# Patient Record
Sex: Female | Born: 1950 | Race: White | Hispanic: No | Marital: Married | Smoking: Former smoker
Health system: Southern US, Community
[De-identification: ages and names within clinical notes are randomized; demographics above are authoritative.]

## PROBLEM LIST (undated history)

## (undated) DIAGNOSIS — E079 Disorder of thyroid, unspecified: Secondary | ICD-10-CM

## (undated) HISTORY — DX: Disorder of thyroid, unspecified: E07.9

## (undated) HISTORY — PX: ADENOIDECTOMY: SUR15

## (undated) HISTORY — PX: COLONOSCOPY: SHX174

---

## 2009-01-31 ENCOUNTER — Encounter (HOSPITAL_COMMUNITY): Admission: RE | Admit: 2009-01-31 | Discharge: 2009-05-01 | Payer: Self-pay | Admitting: Endocrinology

## 2009-02-07 ENCOUNTER — Ambulatory Visit (HOSPITAL_COMMUNITY): Admission: RE | Admit: 2009-02-07 | Discharge: 2009-02-07 | Payer: Self-pay | Admitting: Endocrinology

## 2010-04-30 ENCOUNTER — Ambulatory Visit: Payer: Self-pay | Admitting: Internal Medicine

## 2010-04-30 LAB — HM DEXA SCAN: HM Dexa Scan: NORMAL

## 2010-07-03 ENCOUNTER — Ambulatory Visit: Payer: Self-pay | Admitting: Unknown Physician Specialty

## 2010-07-04 LAB — PATHOLOGY REPORT

## 2011-06-30 ENCOUNTER — Encounter: Payer: Self-pay | Admitting: Internal Medicine

## 2011-06-30 DIAGNOSIS — Z0289 Encounter for other administrative examinations: Secondary | ICD-10-CM

## 2011-08-04 ENCOUNTER — Ambulatory Visit (INDEPENDENT_AMBULATORY_CARE_PROVIDER_SITE_OTHER): Payer: BC Managed Care – PPO | Admitting: Internal Medicine

## 2011-08-04 ENCOUNTER — Encounter: Payer: Self-pay | Admitting: Internal Medicine

## 2011-08-04 DIAGNOSIS — M25531 Pain in right wrist: Secondary | ICD-10-CM

## 2011-08-04 DIAGNOSIS — M25539 Pain in unspecified wrist: Secondary | ICD-10-CM

## 2011-08-04 DIAGNOSIS — E039 Hypothyroidism, unspecified: Secondary | ICD-10-CM

## 2011-08-04 DIAGNOSIS — Z0000 Encounter for general adult medical examination without abnormal findings: Secondary | ICD-10-CM

## 2011-08-04 NOTE — Progress Notes (Signed)
Subjective:    Patient ID: Tara Coleman, female    DOB: 1950-09-16, 60 y.o.   MRN: [id number]  HPI 60 year old female with a history of hypothyroidism presents for annual exam. She denies any complaints today with the exception of mild chronic pain in her right wrist. She is a [employer] forces operational officer and attributes the pain in her wrist to overuse. She reports that she has started to wear a brace with some improvement. Aside from this, she denies any complaints today. She notes that she was recently seen by her endocrinologist and her TSH level which was drawn was normal. She reports healthy diet and regular exercise. She reports normal energy level.  Outpatient Encounter Prescriptions as of 08/04/2011  Medication Sig Dispense Refill  . levothyroxine (SYNTHROID, LEVOTHROID) 100 MCG tablet Take 100 mcg by mouth daily.          Review of Systems  Constitutional: Negative for fever, chills, appetite change, fatigue and unexpected weight change.  HENT: Negative for ear pain, congestion, sore throat, trouble swallowing, neck pain, voice change and sinus pressure.   Eyes: Negative for visual disturbance.  Respiratory: Negative for cough, shortness of breath, wheezing and stridor.   Cardiovascular: Negative for chest pain, palpitations and leg swelling.  Gastrointestinal: Negative for nausea, vomiting, abdominal pain, diarrhea, constipation, blood in stool, abdominal distention and anal bleeding.  Genitourinary: Negative for dysuria and flank pain.  Musculoskeletal: Positive for arthralgias (right wrist). Negative for myalgias and gait problem.  Skin: Negative for color change and rash.  Neurological: Negative for dizziness and headaches.  Hematological: Negative for adenopathy. Does not bruise/bleed easily.  Psychiatric/Behavioral: Negative for suicidal ideas, sleep disturbance and dysphoric mood. The patient is not nervous/anxious.    BP 102/62  Pulse 64  Temp(Src) 98 F (36.7 C) (Oral)  Wt 188  lb (85.276 kg)  SpO2 96%     Objective:   Physical Exam  Constitutional: She is oriented to person, place, and time. She appears well-developed and well-nourished. No distress.  HENT:  Head: Normocephalic and atraumatic.  Right Ear: External ear normal.  Left Ear: External ear normal.  Nose: Nose normal.  Mouth/Throat: Oropharynx is clear and moist. No oropharyngeal exudate.  Eyes: Conjunctivae are normal. Pupils are equal, round, and reactive to light. Right eye exhibits no discharge. Left eye exhibits no discharge. No scleral icterus.  Neck: Normal range of motion. Neck supple. No tracheal deviation present. No thyromegaly present.  Cardiovascular: Normal rate, regular rhythm, normal heart sounds and intact distal pulses.  Exam reveals no gallop and no friction rub.   No murmur heard. Pulmonary/Chest: Effort normal and breath sounds normal. No respiratory distress. She has no wheezes. She has no rales. She exhibits no tenderness. Right breast exhibits no inverted nipple, no mass, no nipple discharge, no skin change and no tenderness. Left breast exhibits no inverted nipple, no mass, no nipple discharge, no skin change and no tenderness. Breasts are symmetrical.  Abdominal: Soft. She exhibits no distension and no mass. There is no tenderness. There is no rebound and no guarding.  Musculoskeletal: Normal range of motion. She exhibits no edema and no tenderness.  Lymphadenopathy:    She has no cervical adenopathy.  Neurological: She is alert and oriented to person, place, and time. No cranial nerve deficit. She exhibits normal muscle tone. Coordination normal.  Skin: Skin is warm and dry. No rash noted. She is not diaphoretic. No erythema. No pallor.  Psychiatric: She has a normal mood and affect. Her behavior is  normal. Judgment and thought content normal.          Assessment & Plan:  1. General exam - Exam including breast exam normal. PAP deferred as normal last year. Mammogram  scheduled. Pt would prefer to repeat labs q2years. Will get records on previous labs and evaluation from former office. Flu vaccine declined.  2. Hypothyroidism - Pt reports recent TSH was normal. Will get records on this. Continue synthroid.  3. 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 Will continue to monitor for now. Will use ibuprofen prn pain. Pt will call if symptoms worsening.

## 2011-12-01 ENCOUNTER — Ambulatory Visit: Payer: Self-pay | Admitting: Internal Medicine

## 2011-12-08 ENCOUNTER — Encounter: Payer: Self-pay | Admitting: Internal Medicine

## 2012-08-05 ENCOUNTER — Encounter: Payer: Self-pay | Admitting: Internal Medicine

## 2012-08-05 ENCOUNTER — Ambulatory Visit (INDEPENDENT_AMBULATORY_CARE_PROVIDER_SITE_OTHER): Payer: BC Managed Care – PPO | Admitting: Internal Medicine

## 2012-08-05 VITALS — BP 104/76 | HR 75 | Temp 97.9°F | Resp 16 | Ht 67.0 in | Wt 187.8 lb

## 2012-08-05 DIAGNOSIS — Z1331 Encounter for screening for depression: Secondary | ICD-10-CM

## 2012-08-05 DIAGNOSIS — Z0000 Encounter for general adult medical examination without abnormal findings: Secondary | ICD-10-CM

## 2012-08-05 DIAGNOSIS — E039 Hypothyroidism, unspecified: Secondary | ICD-10-CM

## 2012-08-05 DIAGNOSIS — Z13220 Encounter for screening for lipoid disorders: Secondary | ICD-10-CM

## 2012-08-05 LAB — CBC WITH DIFFERENTIAL/PLATELET
Basophils Relative: 0.9 % (ref 0.0–3.0)
Eosinophils Absolute: 0.1 10*3/uL (ref 0.0–0.7)
Hemoglobin: 13.9 g/dL (ref 12.0–15.0)
Lymphocytes Relative: 26 % (ref 12.0–46.0)
MCHC: 33.1 g/dL (ref 30.0–36.0)
MCV: 87.6 fl (ref 78.0–100.0)
Neutro Abs: 3.5 10*3/uL (ref 1.4–7.7)
RBC: 4.81 Mil/uL (ref 3.87–5.11)

## 2012-08-05 LAB — COMPREHENSIVE METABOLIC PANEL
ALT: 16 U/L (ref 0–35)
CO2: 28 mEq/L (ref 19–32)
Calcium: 8.8 mg/dL (ref 8.4–10.5)
Chloride: 103 mEq/L (ref 96–112)
GFR: 75.6 mL/min (ref >60.00)
Sodium: 137 mEq/L (ref 135–145)
Total Protein: 7 g/dL (ref 6.0–8.3)

## 2012-08-05 LAB — LIPID PANEL: VLDL: 15.8 mg/dL (ref 0.0–40.0)

## 2012-08-05 LAB — LDL CHOLESTEROL, DIRECT: Direct LDL: 129.8 mg/dL

## 2012-08-05 NOTE — Assessment & Plan Note (Signed)
Symptomatically doing well. Will continue synthroid. Will request records from recent endocrinology evaluation. Follow up 1 year and prn.

## 2012-08-05 NOTE — Assessment & Plan Note (Signed)
General medical exam normal today including breast exam. PAP and pelvic deferred given PAP 2012 normal/HPV neg. Will check labs including CBC, CMP, lipids, Vit D. Health maintenance is UTD.  Mammogram ordered. Follow up 1 year for physical exam.

## 2012-08-05 NOTE — Progress Notes (Signed)
Subjective:    Patient ID: Tara Coleman, female    DOB: 1951/04/04, 61 y.o.   MRN: [id number]  HPI 61YO female with h/o hypothyroidism presents for annual exam. She was recently seen by her dentist and evaluated for left lower gum pain and diagnosed as having an abscess.  She has been treated with amoxicillin and is scheduled for root canal next week. She denies any fever, chills. Pain is controlled with tylenol alone. She reports she is otherwise feeling well. No new concerns today. Follows a healthy diet and gets regular physical activity. Recently traveled to [place].  Outpatient Encounter Prescriptions as of 08/05/2012  Medication Sig Dispense Refill  . amoxicillin (AMOXIL) 500 MG capsule       . levothyroxine (SYNTHROID, LEVOTHROID) 100 MCG tablet Take 100 mcg by mouth daily.        Marland Kitchen VICODIN ES 7.5-300 MG TABS        BP 104/76  Pulse 75  Temp 97.9 F (36.6 C) (Oral)  Resp 16  Ht 5\' 7"  (1.702 m)  Wt 187 lb 12 oz (85.163 kg)  BMI 29.41 kg/m2  SpO2 98%  Review of Systems  Constitutional: Negative for fever, chills, appetite change, fatigue and unexpected weight change.  HENT: Negative for ear pain, congestion, sore throat, trouble swallowing, neck pain, voice change and sinus pressure.   Eyes: Negative for visual disturbance.  Respiratory: Negative for cough, shortness of breath, wheezing and stridor.   Cardiovascular: Negative for chest pain, palpitations and leg swelling.  Gastrointestinal: Negative for nausea, vomiting, abdominal pain, diarrhea, constipation, blood in stool, abdominal distention and anal bleeding.  Genitourinary: Negative for dysuria and flank pain.  Musculoskeletal: Negative for myalgias, arthralgias and gait problem.  Skin: Negative for color change and rash.  Neurological: Negative for dizziness and headaches.  Hematological: Negative for adenopathy. Does not bruise/bleed easily.  Psychiatric/Behavioral: Negative for suicidal ideas, sleep disturbance  and dysphoric mood. The patient is not nervous/anxious.        Objective:   Physical Exam  Constitutional: She is oriented to person, place, and time. She appears well-developed and well-nourished. No distress.  HENT:  Head: Normocephalic and atraumatic.  Right Ear: External ear normal.  Left Ear: External ear normal.  Nose: Nose normal.  Mouth/Throat: Oropharynx is clear and moist. No oropharyngeal exudate.  Eyes: Conjunctivae normal are normal. Pupils are equal, round, and reactive to light. Right eye exhibits no discharge. Left eye exhibits no discharge. No scleral icterus.  Neck: Normal range of motion. Neck supple. No tracheal deviation present. No thyromegaly present.  Cardiovascular: Normal rate, regular rhythm, normal heart sounds and intact distal pulses.  Exam reveals no gallop and no friction rub.   No murmur heard. Pulmonary/Chest: Effort normal and breath sounds normal. No accessory muscle usage. Not tachypneic. No respiratory distress. She has no wheezes. She has no rales. She exhibits no tenderness. Right breast exhibits no inverted nipple, no mass, no nipple discharge, no skin change and no tenderness. Left breast exhibits no inverted nipple, no mass, no nipple discharge, no skin change and no tenderness. Breasts are symmetrical.  Abdominal: Soft. Bowel sounds are normal. She exhibits no distension. There is no tenderness. There is no rebound and no guarding.  Musculoskeletal: Normal range of motion. She exhibits no edema and no tenderness.  Lymphadenopathy:    She has no cervical adenopathy.  Neurological: She is alert and oriented to person, place, and time. No cranial nerve deficit. She exhibits normal muscle tone. Coordination normal.  Skin: Skin is warm and dry. No rash noted. She is not diaphoretic. No erythema. No pallor.  Psychiatric: She has a normal mood and affect. Her behavior is normal. Judgment and thought content normal.          Assessment & Plan:

## 2012-10-01 ENCOUNTER — Other Ambulatory Visit: Payer: Self-pay

## 2012-10-14 ENCOUNTER — Encounter: Payer: Self-pay | Admitting: Internal Medicine

## 2012-12-30 ENCOUNTER — Telehealth: Payer: Self-pay | Admitting: Internal Medicine

## 2012-12-30 DIAGNOSIS — Z1239 Encounter for other screening for malignant neoplasm of breast: Secondary | ICD-10-CM

## 2012-12-30 NOTE — Telephone Encounter (Signed)
Patient needing an order for a mammogram

## 2013-01-19 ENCOUNTER — Ambulatory Visit: Payer: Self-pay | Admitting: Internal Medicine

## 2013-01-26 LAB — HM MAMMOGRAPHY: HM MAMMO: NORMAL

## 2013-01-30 ENCOUNTER — Telehealth: Payer: Self-pay | Admitting: Internal Medicine

## 2013-01-30 NOTE — Telephone Encounter (Signed)
She needs to be seen in a visit to discuss evaluation and treatment of vasculitis. Will need records from [facility].

## 2013-01-30 NOTE — Telephone Encounter (Signed)
Left message to call back  

## 2013-01-30 NOTE — Telephone Encounter (Signed)
Fwd to Dr. Walker 

## 2013-01-30 NOTE — Telephone Encounter (Signed)
Patient Information:  Caller Name: Kelsee  Phone: [phone]  Patient: Tara Coleman, Tara Coleman  Gender: Female  DOB: 1951/05/12  Age: 62 Years  PCP: Ronna Polio (Adults only)  Office Follow Up:  Does the office need to follow up with this patient?: No  Instructions For The Office: N/A  RN Note:  Instructed pt that since she was seen in [facility] by their physicians and pt is not in town to have an office visit here, RN instructed pt to call [facility] that she was seen at and ask them if they can order  anything topically to treat this rash while waiting on their lab results to come back.  Pt also wanted ot know why an email that she sent to Dr Dan Humphreys was never answered.  RN reviewed in chart and noted that Dr Dan Humphreys answered her email approx 1 hour after pt sent it to her.  Pt instructed that Dr Dan Humphreys instructed to set up an appt to discuss.  Pt states that she never received it and she ended up getting the Rx from her dermatologist.  Symptoms  Reason For Call & Symptoms: Pt states that she is in [place] and on 01/28/13 started with a huge bright red rash on both of her legs; non itchy; not painful;  on 01/29/13 she went to [facility] and dx with vasculitis;  pt is wnting to know if MD in the office can start her on any medicine to treat vasculitis  Reviewed Health History In EMR: Yes  Reviewed Medications In EMR: Yes  Reviewed Allergies In EMR: Yes  Reviewed Surgeries / Procedures: Yes  Date of Onset of Symptoms: 01/28/2013  Guideline(s) Used:  Rash or Redness - Localized  Disposition Per Guideline:   Home Care  Reason For Disposition Reached:   Mild localized rash  Advice Given:  Call Back If:  You become worse.  Patient Will Follow Care Advice:  YES

## 2013-01-31 NOTE — Telephone Encounter (Signed)
Pt was calling back after missed call from the office on 01/30/13.  RN advised per Dr Dan Humphreys, that she would need an appt to discuss treatment and evaluation of vasculitis.  RN offered to make pt an appt but Dr Dan Humphreys did not have any appts this afternoon and pt did not want to see anyone.  Pt states she will try to see if her dermatologist can see her today (01/31/13)

## 2013-02-06 ENCOUNTER — Encounter: Payer: Self-pay | Admitting: Internal Medicine

## 2013-06-22 ENCOUNTER — Other Ambulatory Visit: Payer: Self-pay

## 2013-06-29 ENCOUNTER — Encounter: Payer: Self-pay | Admitting: Endocrinology

## 2013-07-04 ENCOUNTER — Other Ambulatory Visit: Payer: BC Managed Care – PPO

## 2013-07-06 ENCOUNTER — Ambulatory Visit: Payer: BC Managed Care – PPO | Admitting: Endocrinology

## 2013-08-07 ENCOUNTER — Encounter: Payer: BC Managed Care – PPO | Admitting: Internal Medicine

## 2013-08-18 ENCOUNTER — Encounter: Payer: BC Managed Care – PPO | Admitting: Internal Medicine

## 2013-09-28 ENCOUNTER — Encounter: Payer: Self-pay | Admitting: Internal Medicine

## 2013-09-28 ENCOUNTER — Ambulatory Visit (INDEPENDENT_AMBULATORY_CARE_PROVIDER_SITE_OTHER): Payer: BC Managed Care – PPO | Admitting: Internal Medicine

## 2013-09-28 VITALS — BP 110/62 | HR 80 | Temp 97.8°F | Ht 67.0 in | Wt 186.0 lb

## 2013-09-28 DIAGNOSIS — E039 Hypothyroidism, unspecified: Secondary | ICD-10-CM

## 2013-09-28 DIAGNOSIS — L719 Rosacea, unspecified: Secondary | ICD-10-CM | POA: Insufficient documentation

## 2013-09-28 DIAGNOSIS — R799 Abnormal finding of blood chemistry, unspecified: Secondary | ICD-10-CM

## 2013-09-28 DIAGNOSIS — Z1283 Encounter for screening for malignant neoplasm of skin: Secondary | ICD-10-CM | POA: Insufficient documentation

## 2013-09-28 DIAGNOSIS — Z0000 Encounter for general adult medical examination without abnormal findings: Secondary | ICD-10-CM | POA: Insufficient documentation

## 2013-09-28 MED ORDER — LEVOTHYROXINE SODIUM 100 MCG PO TABS: 100.0000 ug | ORAL_TABLET | Freq: Every day | ORAL | Status: DC

## 2013-09-28 MED ORDER — BRIMONIDINE TARTRATE 0.33 % EX GEL: CUTANEOUS | Status: DC

## 2013-09-28 NOTE — Assessment & Plan Note (Signed)
Symptomatically doing well. Will check TSH with labs. Continue Levothyroxine. 

## 2013-09-28 NOTE — Progress Notes (Signed)
Subjective:    Patient ID: Tara Coleman, female    DOB: 01-28-1951, 63 y.o.   MRN: [id number]  HPI 63YO female presents for annual exam. She is generally feeling well. No concerns today. Trying to follow healthy diet. Plays tennis 1-2 times per week and walks.  Notes that she had a rash earlier this year, dark red-purple bilateral lower legs. Non-painful. Non-pruritic. No known exposures to new chemicals, detergents, perfumes. Rash resolved without intervention. Diagnosed at urgent care possible vasculitis. No recurrence of rash noted.  Review of Systems  Constitutional: Negative for fever, chills, appetite change, fatigue and unexpected weight change.  HENT: Negative for congestion, ear pain, sinus pressure, sore throat, trouble swallowing and voice change.   Eyes: Negative for visual disturbance.  Respiratory: Negative for cough, shortness of breath, wheezing and stridor.   Cardiovascular: Negative for chest pain, palpitations and leg swelling.  Gastrointestinal: Negative for nausea, vomiting, abdominal pain, diarrhea, constipation, blood in stool, abdominal distention and anal bleeding.  Genitourinary: Negative for dysuria and flank pain.  Musculoskeletal: Negative for arthralgias, gait problem, myalgias and neck pain.  Skin: Negative for color change and rash.  Neurological: Negative for dizziness and headaches.  Hematological: Negative for adenopathy. Does not bruise/bleed easily.  Psychiatric/Behavioral: Negative for suicidal ideas, sleep disturbance and dysphoric mood. The patient is not nervous/anxious.        Objective:    BP 110/62  Pulse 80  Temp(Src) 97.8 F (36.6 C) (Oral)  Ht 5\' 7"  (1.702 m)  Wt 186 lb (84.369 kg)  BMI 29.12 kg/m2  SpO2 98% Physical Exam  Constitutional: She is oriented to person, place, and time. She appears well-developed and well-nourished. No distress.  HENT:  Head: Normocephalic and atraumatic.  Right Ear: External ear normal.  Left  Ear: External ear normal.  Nose: Nose normal.  Mouth/Throat: Oropharynx is clear and moist. No oropharyngeal exudate.  Eyes: Conjunctivae are normal. Pupils are equal, round, and reactive to light. Right eye exhibits no discharge. Left eye exhibits no discharge. No scleral icterus.  Neck: Normal range of motion. Neck supple. No tracheal deviation present. No thyromegaly present.  Cardiovascular: Normal rate, regular rhythm, normal heart sounds and intact distal pulses.  Exam reveals no gallop and no friction rub.   No murmur heard. Pulmonary/Chest: Effort normal and breath sounds normal. No accessory muscle usage. Not tachypneic. No respiratory distress. She has no decreased breath sounds. She has no wheezes. She has no rales. She exhibits no tenderness. Right breast exhibits no inverted nipple, no mass, no nipple discharge, no skin change and no tenderness. Left breast exhibits no inverted nipple, no mass, no nipple discharge, no skin change and no tenderness. Breasts are symmetrical.  Abdominal: Soft. Bowel sounds are normal. She exhibits no distension and no mass. There is no tenderness. There is no rebound and no guarding.  Musculoskeletal: Normal range of motion. She exhibits no edema and no tenderness.  Lymphadenopathy:    She has no cervical adenopathy.  Neurological: She is alert and oriented to person, place, and time. No cranial nerve deficit. She exhibits normal muscle tone. Coordination normal.  Skin: Skin is warm and dry. No rash noted. She is not diaphoretic. No erythema. No pallor.  Psychiatric: She has a normal mood and affect. Her behavior is normal. Judgment and thought content normal.          Assessment & Plan:   Problem List Items Addressed This Visit   Hypothyroidism     Symptomatically doing well.  Will check TSH with labs. Continue Levothyroxine.    Relevant Medications      levothyroxine (SYNTHROID, LEVOTHROID) tablet   Other Relevant Orders      TSH   Rosacea     Relevant Medications      Brimonidine Tartrate (MIRVASO) 0.33 % GEL   Routine general medical examination at a health care facility - Primary     General medical exam including breast exam normal today. PAP and pelvic deferred as normal PAP 04/2010, HPV neg. Plan repeat PAP 2016. Mammogram UTD 01/2013. Colonoscopy due 2016. Encouraged healthy diet and regular physical activity. Discussed Prevnar, and she will look into this. Declines Flu vaccine.    Relevant Orders      CBC with Differential      Comprehensive metabolic panel      Lipid panel      Vit D  25 hydroxy (rtn osteoporosis monitoring)   Screening for skin cancer   Relevant Orders      Ambulatory referral to Dermatology       Return in about 1 year (around 09/28/2014) for Physical.

## 2013-09-28 NOTE — Progress Notes (Signed)
Pre-visit discussion using our clinic review tool. No additional management support is needed unless otherwise documented below in the visit note.  

## 2013-09-28 NOTE — Assessment & Plan Note (Signed)
Symptoms well controlled with Mirvaso. Will continue.

## 2013-09-28 NOTE — Assessment & Plan Note (Signed)
General medical exam including breast exam normal today. PAP and pelvic deferred as normal PAP 04/2010, HPV neg. Plan repeat PAP 2016. Mammogram UTD 01/2013. Colonoscopy due 2016. Encouraged healthy diet and regular physical activity. Discussed Prevnar, and she will look into this. Declines Flu vaccine.

## 2013-09-29 LAB — TSH: TSH: 3.23 u[IU]/mL (ref 0.35–5.50)

## 2013-09-29 LAB — COMPREHENSIVE METABOLIC PANEL
ALT: 16 U/L (ref 0–35)
AST: 20 U/L (ref 0–37)
Albumin: 4.1 g/dL (ref 3.5–5.2)
Alkaline Phosphatase: 70 U/L (ref 39–117)
BUN: 14 mg/dL (ref 6–23)
CALCIUM: 8.9 mg/dL (ref 8.4–10.5)
CO2: 22 mEq/L (ref 19–32)
CREATININE: 1 mg/dL (ref 0.4–1.2)
Chloride: 105 mEq/L (ref 96–112)
GFR: 60.59 mL/min (ref >60.00)
Glucose, Bld: 83 mg/dL (ref 70–99)
POTASSIUM: 3.9 meq/L (ref 3.5–5.1)
SODIUM: 138 meq/L (ref 135–145)
Total Bilirubin: 0.8 mg/dL (ref 0.3–1.2)
Total Protein: 7.2 g/dL (ref 6.0–8.3)

## 2013-09-29 LAB — CBC WITH DIFFERENTIAL/PLATELET
BASOS ABS: 0 10*3/uL (ref 0.0–0.1)
Basophils Relative: 0.6 % (ref 0.0–3.0)
Eosinophils Absolute: 0.1 10*3/uL (ref 0.0–0.7)
Eosinophils Relative: 1.6 % (ref 0.0–5.0)
HEMATOCRIT: 42.8 % (ref 36.0–46.0)
Hemoglobin: 14 g/dL (ref 12.0–15.0)
LYMPHS ABS: 2.2 10*3/uL (ref 0.7–4.0)
Lymphocytes Relative: 29.2 % (ref 12.0–46.0)
MCHC: 32.7 g/dL (ref 30.0–36.0)
MCV: 89 fl (ref 78.0–100.0)
MONO ABS: 0.7 10*3/uL (ref 0.1–1.0)
Monocytes Relative: 9 % (ref 3.0–12.0)
NEUTROS PCT: 59.6 % (ref 43.0–77.0)
Neutro Abs: 4.5 10*3/uL (ref 1.4–7.7)
PLATELETS: 289 10*3/uL (ref 150.0–400.0)
RBC: 4.8 Mil/uL (ref 3.87–5.11)
RDW: 14.1 % (ref 11.5–14.6)
WBC: 7.6 10*3/uL (ref 4.5–10.5)

## 2013-09-29 LAB — LIPID PANEL
CHOLESTEROL: 217 mg/dL — AB (ref 0–200)
HDL: 71.4 mg/dL (ref >39.00)
TRIGLYCERIDES: 115 mg/dL (ref 0.0–149.0)
Total CHOL/HDL Ratio: 3
VLDL: 23 mg/dL (ref 0.0–40.0)

## 2013-09-29 LAB — LDL CHOLESTEROL, DIRECT: Direct LDL: 129.7 mg/dL

## 2013-09-29 LAB — VITAMIN D 25 HYDROXY (VIT D DEFICIENCY, FRACTURES): Vit D, 25-Hydroxy: 32 ng/mL (ref 30–89)

## 2013-10-05 ENCOUNTER — Encounter: Payer: Self-pay | Admitting: Emergency Medicine

## 2013-11-15 DIAGNOSIS — D239 Other benign neoplasm of skin, unspecified: Secondary | ICD-10-CM

## 2013-11-15 HISTORY — DX: Other benign neoplasm of skin, unspecified: D23.9

## 2014-01-13 ENCOUNTER — Other Ambulatory Visit: Payer: Self-pay | Admitting: Endocrinology

## 2014-01-22 ENCOUNTER — Ambulatory Visit: Payer: Self-pay | Admitting: Internal Medicine

## 2014-01-22 LAB — HM MAMMOGRAPHY: HM MAMMO: NEGATIVE

## 2014-01-31 ENCOUNTER — Encounter: Payer: Self-pay | Admitting: *Deleted

## 2014-10-02 ENCOUNTER — Ambulatory Visit (INDEPENDENT_AMBULATORY_CARE_PROVIDER_SITE_OTHER): Payer: BLUE CROSS/BLUE SHIELD | Admitting: Internal Medicine

## 2014-10-02 ENCOUNTER — Encounter: Payer: Self-pay | Admitting: Internal Medicine

## 2014-10-02 VITALS — BP 111/68 | HR 82 | Temp 97.9°F | Ht 67.25 in | Wt 194.0 lb

## 2014-10-02 DIAGNOSIS — Z0000 Encounter for general adult medical examination without abnormal findings: Secondary | ICD-10-CM

## 2014-10-02 DIAGNOSIS — R0600 Dyspnea, unspecified: Secondary | ICD-10-CM

## 2014-10-02 DIAGNOSIS — E669 Obesity, unspecified: Secondary | ICD-10-CM

## 2014-10-02 DIAGNOSIS — E039 Hypothyroidism, unspecified: Secondary | ICD-10-CM

## 2014-10-02 LAB — HEMOGLOBIN A1C: HEMOGLOBIN A1C: 5.6 % (ref 4.6–6.5)

## 2014-10-02 LAB — CBC WITH DIFFERENTIAL/PLATELET
BASOS PCT: 0.3 % (ref 0.0–3.0)
Basophils Absolute: 0 10*3/uL (ref 0.0–0.1)
EOS ABS: 0.1 10*3/uL (ref 0.0–0.7)
EOS PCT: 1.3 % (ref 0.0–5.0)
HEMATOCRIT: 42.8 % (ref 36.0–46.0)
HEMOGLOBIN: 14.1 g/dL (ref 12.0–15.0)
LYMPHS ABS: 1.8 10*3/uL (ref 0.7–4.0)
Lymphocytes Relative: 22.6 % (ref 12.0–46.0)
MCHC: 33.1 g/dL (ref 30.0–36.0)
MCV: 86.7 fl (ref 78.0–100.0)
MONO ABS: 0.5 10*3/uL (ref 0.1–1.0)
Monocytes Relative: 6 % (ref 3.0–12.0)
NEUTROS ABS: 5.5 10*3/uL (ref 1.4–7.7)
NEUTROS PCT: 69.8 % (ref 43.0–77.0)
Platelets: 307 10*3/uL (ref 150.0–400.0)
RBC: 4.94 Mil/uL (ref 3.87–5.11)
RDW: 13.9 % (ref 11.5–15.5)
WBC: 7.9 10*3/uL (ref 4.0–10.5)

## 2014-10-02 LAB — MICROALBUMIN / CREATININE URINE RATIO
Creatinine,U: 124.6 mg/dL
MICROALB/CREAT RATIO: 0.6 mg/g (ref 0.0–30.0)
Microalb, Ur: <0.7 mg/dL (ref 0.0–1.9)

## 2014-10-02 LAB — COMPREHENSIVE METABOLIC PANEL
ALK PHOS: 80 U/L (ref 39–117)
ALT: 14 U/L (ref 0–35)
AST: 17 U/L (ref 0–37)
Albumin: 4.1 g/dL (ref 3.5–5.2)
BILIRUBIN TOTAL: 0.7 mg/dL (ref 0.2–1.2)
BUN: 13 mg/dL (ref 6–23)
CHLORIDE: 104 meq/L (ref 96–112)
CO2: 28 meq/L (ref 19–32)
CREATININE: 1 mg/dL (ref 0.40–1.20)
Calcium: 8.9 mg/dL (ref 8.4–10.5)
GFR: 59.69 mL/min — AB (ref >60.00)
Glucose, Bld: 125 mg/dL — ABNORMAL HIGH (ref 70–99)
Potassium: 4.2 mEq/L (ref 3.5–5.1)
Sodium: 137 mEq/L (ref 135–145)
Total Protein: 6.9 g/dL (ref 6.0–8.3)

## 2014-10-02 LAB — VITAMIN D 25 HYDROXY (VIT D DEFICIENCY, FRACTURES): VITD: 26.05 ng/mL — ABNORMAL LOW (ref 30.00–100.00)

## 2014-10-02 LAB — TSH: TSH: 1.76 u[IU]/mL (ref 0.35–4.50)

## 2014-10-02 LAB — LIPID PANEL
CHOLESTEROL: 198 mg/dL (ref 0–200)
HDL: 63.3 mg/dL (ref >39.00)
LDL Cholesterol: 118 mg/dL — ABNORMAL HIGH (ref 0–99)
NONHDL: 134.7
Total CHOL/HDL Ratio: 3
Triglycerides: 86 mg/dL (ref 0.0–149.0)
VLDL: 17.2 mg/dL (ref 0.0–40.0)

## 2014-10-02 MED ORDER — LEVOTHYROXINE SODIUM 100 MCG PO TABS: ORAL_TABLET | ORAL | Status: DC

## 2014-10-02 NOTE — Assessment & Plan Note (Signed)
Wt Readings from Last 3 Encounters:  10/02/14 194 lb (87.998 kg)  09/28/13 186 lb (84.369 kg)  08/05/12 187 lb 12 oz (85.163 kg)   Body mass index is 30.16 kg/(m^2). Encouraged healthy diet and exercise.

## 2014-10-02 NOTE — Patient Instructions (Signed)

## 2014-10-02 NOTE — Assessment & Plan Note (Signed)
Will check TSH with labs. Continue Levothyroxine. 

## 2014-10-02 NOTE — Assessment & Plan Note (Signed)
General medical exam including breast exam normal today. PAP and pelvic deferred per pt preference. Last PAP 2011 normal, HPV neg. Mammogram UTD and reviewed. Colonoscopy due in 06/2015. Flu vaccine declined. Labs today including CBC, CMP ,lipids, TSH, A1c. Encouraged healthy diet and exercise.

## 2014-10-02 NOTE — Addendum Note (Signed)
Addended by: Ronette Deter A on: 10/02/2014 01:59 PM   Modules accepted: Miquel Dunn

## 2014-10-02 NOTE — Progress Notes (Signed)
Pre visit review using our clinic review tool, if applicable. No additional management support is needed unless otherwise documented below in the visit note. 

## 2014-10-02 NOTE — Progress Notes (Addendum)
Subjective:    Patient ID: Tara Coleman, female    DOB: 06/17/1951, 64 y.o.   MRN: [id number]  HPI  64YO female presents for annual exam.  Trying to eat healthy. Plays tennis for exercise. Occasionally smokes, perhaps less than 1 pack per week off and on. Has quit several times. Notes some dyspnea this year when walking up steps in [place]. No current symptoms of dyspnea, chest pain, cough. She plays tennis on a regular basis with no problems.  Wt Readings from Last 3 Encounters:  10/02/14 194 lb (87.998 kg)  09/28/13 186 lb (84.369 kg)  08/05/12 187 lb 12 oz (85.163 kg)    Past medical, surgical, family and social history per today's encounter.  Review of Systems  Constitutional: Negative for fever, chills, appetite change, fatigue and unexpected weight change.  Eyes: Negative for visual disturbance.  Respiratory: Positive for shortness of breath. Negative for cough and wheezing.   Cardiovascular: Negative for chest pain and leg swelling.  Gastrointestinal: Negative for nausea, vomiting, abdominal pain, diarrhea and constipation.  Musculoskeletal: Negative for myalgias and arthralgias.  Skin: Negative for color change and rash.  Hematological: Negative for adenopathy. Does not bruise/bleed easily.  Psychiatric/Behavioral: Negative for sleep disturbance and dysphoric mood. The patient is not nervous/anxious.        Objective:    BP 111/68 mmHg  Pulse 82  Temp(Src) 97.9 F (36.6 C) (Oral)  Ht 5' 7.25" (1.708 m)  Wt 194 lb (87.998 kg)  BMI 30.16 kg/m2  SpO2 99% Physical Exam  Constitutional: She is oriented to person, place, and time. She appears well-developed and well-nourished. No distress.  HENT:  Head: Normocephalic and atraumatic.  Right Ear: External ear normal.  Left Ear: External ear normal.  Nose: Nose normal.  Mouth/Throat: Oropharynx is clear and moist. No oropharyngeal exudate.  Eyes: Conjunctivae are normal. Pupils are equal, round, and  reactive to light. Right eye exhibits no discharge. Left eye exhibits no discharge. No scleral icterus.  Neck: Normal range of motion. Neck supple. No tracheal deviation present. No thyromegaly present.  Cardiovascular: Normal rate, regular rhythm, normal heart sounds and intact distal pulses.  Exam reveals no gallop and no friction rub.   No murmur heard. Pulmonary/Chest: Effort normal and breath sounds normal. No accessory muscle usage. No tachypnea. No respiratory distress. She has no decreased breath sounds. She has no wheezes. She has no rales. She exhibits no tenderness. Right breast exhibits no inverted nipple, no mass, no nipple discharge, no skin change and no tenderness. Left breast exhibits no inverted nipple, no mass, no nipple discharge, no skin change and no tenderness. Breasts are symmetrical.  Abdominal: Soft. Bowel sounds are normal. She exhibits no distension and no mass. There is no tenderness. There is no rebound and no guarding.  Musculoskeletal: Normal range of motion. She exhibits no edema or tenderness.  Lymphadenopathy:    She has no cervical adenopathy.  Neurological: She is alert and oriented to person, place, and time. No cranial nerve deficit. She exhibits normal muscle tone. Coordination normal.  Skin: Skin is warm and dry. No rash noted. She is not diaphoretic. No erythema. No pallor.  Psychiatric: She has a normal mood and affect. Her behavior is normal. Judgment and thought content normal.          Assessment & Plan:   Problem List Items Addressed This Visit      Unprioritized   Dyspnea    Occasional mild dyspnea on exertion, ie walking up  50 steps. Likely deconditioning.Will get CXR given smoking history. Exam is normal. Pt will follow up if any worsening or persistent symptoms.      Relevant Orders   DG Chest 2 View   Hypothyroidism    Will check TSH with labs. Continue Levothyroxine.      Relevant Medications   levothyroxine (SYNTHROID,  LEVOTHROID) tablet   Obesity (BMI 30-39.9)    Wt Readings from Last 3 Encounters:  10/02/14 194 lb (87.998 kg)  09/28/13 186 lb (84.369 kg)  08/05/12 187 lb 12 oz (85.163 kg)   Body mass index is 30.16 kg/(m^2). Encouraged healthy diet and exercise.      Routine general medical examination at a health care facility - Primary    General medical exam including breast exam normal today. PAP and pelvic deferred per pt preference. Last PAP 2011 normal, HPV neg. Mammogram UTD and reviewed. Colonoscopy due in 06/2015. Flu vaccine declined. Labs today including CBC, CMP ,lipids, TSH, A1c. Encouraged healthy diet and exercise.      Relevant Orders   CBC with Differential/Platelet   Comprehensive metabolic panel   Lipid panel   Microalbumin / creatinine urine ratio   Vit D  25 hydroxy (rtn osteoporosis monitoring)   TSH   Hemoglobin A1c       Return in about 1 year (around 10/03/2015) for Physical.

## 2014-10-02 NOTE — Assessment & Plan Note (Signed)
Occasional mild dyspnea on exertion, ie walking up 50 steps. Likely deconditioning.Will get CXR given smoking history. Exam is normal. Pt will follow up if any worsening or persistent symptoms.

## 2014-10-08 ENCOUNTER — Ambulatory Visit (INDEPENDENT_AMBULATORY_CARE_PROVIDER_SITE_OTHER)
Admission: RE | Admit: 2014-10-08 | Discharge: 2014-10-08 | Disposition: A | Discharge status: Home / Self Care | Payer: BLUE CROSS/BLUE SHIELD | Source: Ambulatory Visit | Attending: Internal Medicine | Admitting: Internal Medicine

## 2014-10-08 DIAGNOSIS — R0600 Dyspnea, unspecified: Secondary | ICD-10-CM

## 2014-12-10 ENCOUNTER — Other Ambulatory Visit: Payer: Self-pay | Admitting: Internal Medicine

## 2015-09-24 ENCOUNTER — Ambulatory Visit (INDEPENDENT_AMBULATORY_CARE_PROVIDER_SITE_OTHER): Payer: BLUE CROSS/BLUE SHIELD | Admitting: Internal Medicine

## 2015-09-24 ENCOUNTER — Encounter: Payer: Self-pay | Admitting: Internal Medicine

## 2015-09-24 VITALS — BP 110/71 | HR 87 | Temp 98.8°F | Wt 179.0 lb

## 2015-09-24 DIAGNOSIS — R42 Dizziness and giddiness: Secondary | ICD-10-CM | POA: Diagnosis not present

## 2015-09-24 LAB — TSH: TSH: 1.38 u[IU]/mL (ref 0.35–4.50)

## 2015-09-24 LAB — COMPREHENSIVE METABOLIC PANEL
ALBUMIN: 4.5 g/dL (ref 3.5–5.2)
ALK PHOS: 84 U/L (ref 39–117)
ALT: 15 U/L (ref 0–35)
AST: 21 U/L (ref 0–37)
BILIRUBIN TOTAL: 0.9 mg/dL (ref 0.2–1.2)
BUN: 15 mg/dL (ref 6–23)
CALCIUM: 9.7 mg/dL (ref 8.4–10.5)
CHLORIDE: 102 meq/L (ref 96–112)
CO2: 30 mEq/L (ref 19–32)
CREATININE: 0.81 mg/dL (ref 0.40–1.20)
GFR: 75.89 mL/min (ref >60.00)
Glucose, Bld: 82 mg/dL (ref 70–99)
Potassium: 4.1 mEq/L (ref 3.5–5.1)
Sodium: 138 mEq/L (ref 135–145)
Total Protein: 7.4 g/dL (ref 6.0–8.3)

## 2015-09-24 LAB — CBC WITH DIFFERENTIAL/PLATELET
BASOS PCT: 0.6 % (ref 0.0–3.0)
Basophils Absolute: 0 10*3/uL (ref 0.0–0.1)
EOS ABS: 0.2 10*3/uL (ref 0.0–0.7)
EOS PCT: 2.2 % (ref 0.0–5.0)
HCT: 45.9 % (ref 36.0–46.0)
HEMOGLOBIN: 15.3 g/dL — AB (ref 12.0–15.0)
LYMPHS ABS: 2.2 10*3/uL (ref 0.7–4.0)
Lymphocytes Relative: 31.4 % (ref 12.0–46.0)
MCHC: 33.4 g/dL (ref 30.0–36.0)
MCV: 86.7 fl (ref 78.0–100.0)
MONO ABS: 0.5 10*3/uL (ref 0.1–1.0)
Monocytes Relative: 7.5 % (ref 3.0–12.0)
NEUTROS ABS: 4.1 10*3/uL (ref 1.4–7.7)
NEUTROS PCT: 58.3 % (ref 43.0–77.0)
PLATELETS: 296 10*3/uL (ref 150.0–400.0)
RBC: 5.3 Mil/uL — ABNORMAL HIGH (ref 3.87–5.11)
RDW: 13.9 % (ref 11.5–15.5)
WBC: 7 10*3/uL (ref 4.0–10.5)

## 2015-09-24 LAB — VITAMIN B12: Vitamin B-12: 309 pg/mL (ref 211–911)

## 2015-09-24 MED ORDER — MECLIZINE HCL 25 MG PO TABS: 25.0000 mg | ORAL_TABLET | Freq: Three times a day (TID) | ORAL | Status: DC | PRN

## 2015-09-24 MED ORDER — PREDNISONE 10 MG PO TABS: ORAL_TABLET | ORAL | Status: DC

## 2015-09-24 NOTE — Progress Notes (Signed)
Subjective:    Patient ID: Tara Coleman, female    DOB: 05-02-1951, 65 y.o.   MRN: [id number]  HPI  65YO female presents for acute visit.  Dizziness - Started suddenly last Saturday when sitting waiting for a meeting. Described as vertigo. Lasted about 15-30sec. Then occurred while sitting at tennis match.No chest pain, nausea, diaphoresis, palpitations during episodes. Thursday, occurred when driving. Had to pull over and wait for symptoms to resolve. In between episodes, feels well. Denies anxiety. No recent changes to medication. Has been on weight watchers for a few months. Hit head on cabinet several days before dizziness started. No LOC, dizziness, nausea after this event. No headache. No recent illness. No congestion, fever. Denies ear pain. Does occasionally have allergy symptoms including watery eyes and rhinorrhea. Not taking anything for symptoms.    Wt Readings from Last 3 Encounters:  09/24/15 179 lb (81.194 kg)  10/02/14 194 lb (87.998 kg)  09/28/13 186 lb (84.369 kg)   BP Readings from Last 3 Encounters:  09/24/15 110/71  10/02/14 111/68  09/28/13 110/62    Past Medical History  Diagnosis Date  . Thyroid disease    Family History  Problem Relation Age of Onset  . Cancer Mother   . Thyroid disease Sister   . Breast cancer Neg Hx   . Colon cancer Neg Hx   . Psoriasis Son    Past Surgical History  Procedure Laterality Date  . Cesarean section      x2   . Adenoidectomy      as a child   Social History   Social History  . Marital Status: Married    Spouse Name: N/A  . Number of Children: 2  . Years of Education: N/A   Occupational History  . Self Employed - Tennis    Social History Main Topics  . Smoking status: Current Every Day Smoker    Last Attempt to Quit: 08/03/2009  . Smokeless tobacco: Never Used  . Alcohol Use: 0.0 oz/week    0 Standard drinks or equivalent per week     Comment: Glass wine hs  . Drug Use: No  . Sexual  Activity: Not Asked   Other Topics Concern  . None   Social History Narrative    Review of Systems  Constitutional: Negative for fever, chills, fatigue and unexpected weight change.  HENT: Positive for rhinorrhea and sneezing. Negative for congestion, ear discharge, ear pain, facial swelling, hearing loss, mouth sores, nosebleeds, postnasal drip, sinus pressure, sore throat, tinnitus, trouble swallowing and voice change.   Eyes: Negative for pain, discharge, redness and visual disturbance.  Respiratory: Negative for cough, chest tightness, shortness of breath, wheezing and stridor.   Cardiovascular: Negative for chest pain, palpitations and leg swelling.  Gastrointestinal: Negative for nausea and vomiting.  Musculoskeletal: Negative for myalgias, arthralgias, neck pain and neck stiffness.  Skin: Negative for color change and rash.  Neurological: Positive for dizziness. Negative for tremors, seizures, syncope, speech difficulty, weakness, light-headedness, numbness and headaches.  Hematological: Negative for adenopathy.       Objective:    BP 110/71 mmHg  Pulse 87  Temp(Src) 98.8 F (37.1 C) (Oral)  Wt 179 lb (81.194 kg)  SpO2 100% Physical Exam  Constitutional: She is oriented to person, place, and time. She appears well-developed and well-nourished. No distress.  HENT:  Head: Normocephalic and atraumatic.  Right Ear: External ear normal. Tympanic membrane is bulging. Tympanic membrane is not erythematous. A middle ear effusion is  present.  Left Ear: External ear normal. Tympanic membrane is bulging. Tympanic membrane is not erythematous. A middle ear effusion is present.  Nose: Nose normal.  Mouth/Throat: Oropharynx is clear and moist. No oropharyngeal exudate.  Eyes: Conjunctivae are normal. Pupils are equal, round, and reactive to light. Right eye exhibits no discharge. Left eye exhibits no discharge. No scleral icterus.  Neck: Normal range of motion. Neck supple. Carotid  bruit is not present. No tracheal deviation present. No thyroid mass and no thyromegaly present.  Cardiovascular: Normal rate, regular rhythm, normal heart sounds and intact distal pulses.  Exam reveals no gallop and no friction rub.   No murmur heard. Pulmonary/Chest: Effort normal and breath sounds normal. No respiratory distress. She has no wheezes. She has no rales. She exhibits no tenderness.  Musculoskeletal: Normal range of motion. She exhibits no edema or tenderness.  Lymphadenopathy:    She has no cervical adenopathy.  Neurological: She is alert and oriented to person, place, and time. No cranial nerve deficit. She exhibits normal muscle tone. Coordination normal.  Skin: Skin is warm and dry. No rash noted. She is not diaphoretic. No erythema. No pallor.  Psychiatric: She has a normal mood and affect. Her behavior is normal. Judgment and thought content normal.          Assessment & Plan:   Problem List Items Addressed This Visit      Unprioritized   Vertigo - Primary    We discussed potential causes of vertigo and potential additional evaluation. Exam is remarkable for bilateral middle ear effusion. Suspect that increased pressure in the middle ear causing vertigo. Will try Prednisone taper. Add Meclizine prn. Discussed potential CT head given her head injury prior to onset of symptoms, however she would like to avoid if possible given the cost. We also discussed potentially ordering carotid dopplers if symptoms persist. Will check labs today including electrolytes, thyroid function, blood counts, B12. Follow up by email in 2 days. If no improvement, consider CT head and ENT evaluation.      Relevant Orders   CBC with Differential/Platelet   Comprehensive metabolic panel   TSH   [id number]       Return in about 2 weeks (around 10/08/2015) for Recheck.

## 2015-09-24 NOTE — Assessment & Plan Note (Signed)
We discussed potential causes of vertigo and potential additional evaluation. Exam is remarkable for bilateral middle ear effusion. Suspect that increased pressure in the middle ear causing vertigo. Will try Prednisone taper. Add Meclizine prn. Discussed potential CT head given her head injury prior to onset of symptoms, however she would like to avoid if possible given the cost. We also discussed potentially ordering carotid dopplers if symptoms persist. Will check labs today including electrolytes, thyroid function, blood counts, B12. Follow up by email in 2 days. If no improvement, consider CT head and ENT evaluation.

## 2015-09-24 NOTE — Progress Notes (Signed)
Pre visit review using our clinic review tool, if applicable. No additional management support is needed unless otherwise documented below in the visit note. 

## 2015-09-24 NOTE — Patient Instructions (Signed)
Start Prednisone taper today.  Use Meclizine as needed for dizziness.  Labs today.  Email with update Thursday. If no improvement, we will consider ENT referral and imaging.

## 2015-09-27 ENCOUNTER — Encounter: Payer: Self-pay | Admitting: Internal Medicine

## 2015-10-04 ENCOUNTER — Encounter: Payer: BLUE CROSS/BLUE SHIELD | Admitting: Internal Medicine

## 2015-11-26 ENCOUNTER — Other Ambulatory Visit: Payer: Self-pay | Admitting: Internal Medicine

## 2016-05-13 ENCOUNTER — Other Ambulatory Visit: Payer: Self-pay

## 2016-05-13 MED ORDER — LEVOTHYROXINE SODIUM 100 MCG PO TABS: ORAL_TABLET | ORAL | 1 refills | Status: DC

## 2016-08-26 ENCOUNTER — Ambulatory Visit (INDEPENDENT_AMBULATORY_CARE_PROVIDER_SITE_OTHER): Payer: BLUE CROSS/BLUE SHIELD | Admitting: Family

## 2016-08-26 ENCOUNTER — Encounter: Payer: Self-pay | Admitting: Family

## 2016-08-26 VITALS — BP 126/66 | HR 70 | Temp 97.9°F | Ht 67.0 in | Wt 184.0 lb

## 2016-08-26 DIAGNOSIS — Z0000 Encounter for general adult medical examination without abnormal findings: Secondary | ICD-10-CM

## 2016-08-26 MED ORDER — PNEUMOCOCCAL VAC POLYVALENT 25 MCG/0.5ML IJ INJ: 0.5000 mL | INJECTION | Freq: Once | INTRAMUSCULAR | Status: DC

## 2016-08-26 NOTE — Progress Notes (Signed)
Pre visit review using our clinic review tool, if applicable. No additional management support is needed unless otherwise documented below in the visit note. 

## 2016-08-26 NOTE — Patient Instructions (Addendum)
Due for Tdap- at local pharmacy.   No results for colonoscopy in system. Please bring by at your convenient.  We placed a referral. Mammogram this year. I asked that you call one the below locations and schedule this when it is convenient for you.   If you have dense breasts, you may ask for 3D mammogram over the traditional 2D mammogram as new evidence suggest 3D is superior. Please note that NOT all insurance companies cover 3D and you may have to pay a higher copay. You may call your insurance company to further clarify your benefits.   Options for [place]  [place], [place]  * [facility] [place] [place] * Note if you ask for 3D mammogram at this location, you must request [place], [place] location*

## 2016-08-26 NOTE — Progress Notes (Signed)
Subjective:    Patient ID: Tara Coleman, female    DOB: 25-Aug-1950, 66 y.o.   MRN: [id number]  CC: Tara Coleman is a 66 y.o. female who presents today for physical exam.    HPI: Hypothyroidism- no symptoms. Recently switched to generic.     Colorectal Cancer Screening: UTD 2011, Dr Tiffany Kocher, one polyp,negative for malignancy; on 5 year plan.  Breast Cancer Screening: Due Cervical Cancer Screening: no h/o abnormal pap 6 years ago, negative HPV and malignancy. NO vaginal bleeding.  Bone Health screening/DEXA for 65+: No increased fracture risk. Defer screening at this time. Normal DEXA 2011. Lung Cancer Screening: Has 30 year pack year history and age > 98 years.  Immunizations       Tetanus - Due        Pneumococcal - Candidate for.  Hepatitis C screening - Candidate for HIV Screening- Candidate for  Labs: Screening labs today. Exercise: Gets regular exercise, walks dog, and plays tennis  Alcohol use: occasional Smoking/tobacco use: smoker.  Regular dental exams:UTD Wears seat belt: Yes.  HISTORY:  Past Medical History:  Diagnosis Date  . Thyroid disease     Past Surgical History:  Procedure Laterality Date  . ADENOIDECTOMY     as a child  . CESAREAN SECTION     x2    Family History  Problem Relation Age of Onset  . Cancer Mother 62    biliary duct  . Thyroid disease Sister   . Psoriasis Son   . Colon cancer Maternal Grandfather     lived to 44  . Breast cancer Neg Hx       ALLERGIES: Patient has no known allergies.  Current Outpatient Prescriptions on File Prior to Visit  Medication Sig Dispense Refill  . levothyroxine (SYNTHROID, LEVOTHROID) 100 MCG tablet TAKE 1 TABLET BY MOUTH EVERY MORNING ON AN EMPTY STOMACH 90 tablet 1   No current facility-administered medications on file prior to visit.     Social History  Substance Use Topics  . Smoking status: Current Every Day Smoker    Last attempt to quit: 08/03/2009  . Smokeless tobacco:  Never Used  . Alcohol use 0.0 oz/week     Comment: Glass wine hs    Review of Systems  Constitutional: Negative for chills, fever and unexpected weight change.  HENT: Negative for congestion.   Respiratory: Negative for cough.   Cardiovascular: Negative for chest pain, palpitations and leg swelling.  Gastrointestinal: Negative for nausea and vomiting.  Genitourinary: Negative for dyspareunia, pelvic pain and vaginal bleeding.  Musculoskeletal: Negative for arthralgias and myalgias.  Skin: Negative for rash.  Neurological: Negative for headaches.  Hematological: Negative for adenopathy.  Psychiatric/Behavioral: Negative for confusion.      Objective:    BP 126/66   Pulse 70   Temp 97.9 F (36.6 C) (Oral)   Ht 5\' 7"  (1.702 m)   Wt 184 lb (83.5 kg)   SpO2 97%   BMI 28.82 kg/m   BP Readings from Last 3 Encounters:  08/26/16 126/66  09/24/15 110/71  10/02/14 111/68   Wt Readings from Last 3 Encounters:  08/26/16 184 lb (83.5 kg)  09/24/15 179 lb (81.2 kg)  10/02/14 194 lb (88 kg)    Physical Exam  Constitutional: She appears well-developed and well-nourished.  Eyes: Conjunctivae are normal.  Neck: No thyroid mass and no thyromegaly present.  Cardiovascular: Normal rate, regular rhythm, normal heart sounds and normal pulses.   Pulmonary/Chest: Effort normal and breath  sounds normal. She has no wheezes. She has no rhonchi. She has no rales. Right breast exhibits mass.    CBE performed. Small nodule right breast approx 6o clock   Lymphadenopathy:       Head (right side): No submental, no submandibular, no tonsillar, no preauricular, no posterior auricular and no occipital adenopathy present.       Head (left side): No submental, no submandibular, no tonsillar, no preauricular, no posterior auricular and no occipital adenopathy present.    She has no cervical adenopathy.       Right cervical: No superficial cervical, no deep cervical and no posterior cervical adenopathy  present.      Left cervical: No superficial cervical, no deep cervical and no posterior cervical adenopathy present.    She has no axillary adenopathy.  Neurological: She is alert.  Skin: Skin is warm and dry.  Psychiatric: She has a normal mood and affect. Her speech is normal and behavior is normal. Thought content normal.  Vitals reviewed.      Assessment & Plan:   Problem List Items Addressed This Visit      Other   Routine general medical examination at a health care facility - Primary    Declines flu vaccine. Advised to get Tdap at local pharmacy.Advised to bring by colonoscopy report from [place] from 2011 ( do not have). No longer does Pap smears due to age and preference. Declines pelvic exam today. On breast exam appreciated right breast mass. Have ordered a diagnostic mammogram, and ultrasound. Pneumovax was not given, will be given at followup.       Relevant Medications   pneumococcal 23 valent vaccine (PNU-IMMUNE) injection 0.5 mL   Other Relevant Orders   CBC with Differential/Platelet   Hemoglobin A1c   Hepatitis C antibody   HIV antibody   Comprehensive metabolic panel   Lipid panel   TSH   VITAMIN D 25 Hydroxy (Vit-D Deficiency, Fractures)   US BREAST LTD UNI RIGHT INC AXILLA   MM Digital Diagnostic Bilat   CT CHEST LUNG CANCER SCREENING LOW DOSE WO CONTRAST       I have discontinued Ms. Messing's Brimonidine Tartrate, predniSONE, and meclizine. I am also having her maintain her levothyroxine. We will continue to administer pneumococcal 23 valent vaccine.   Meds ordered this encounter  Medications  . pneumococcal 23 valent vaccine (PNU-IMMUNE) injection 0.5 mL    Return precautions given.   Risks, benefits, and alternatives of the medications and treatment plan prescribed today were discussed, and patient expressed understanding.   Education regarding symptom management and diagnosis given to patient on AVS.   Continue to follow with Mable Paris, FNP  for routine health maintenance.   Detria L Dorr and I agreed with plan.   Mable Paris, FNP

## 2016-08-27 ENCOUNTER — Encounter: Payer: Self-pay | Admitting: Family

## 2016-08-27 ENCOUNTER — Other Ambulatory Visit: Payer: Self-pay | Admitting: Family

## 2016-08-27 ENCOUNTER — Other Ambulatory Visit (INDEPENDENT_AMBULATORY_CARE_PROVIDER_SITE_OTHER): Payer: BLUE CROSS/BLUE SHIELD

## 2016-08-27 DIAGNOSIS — Z0000 Encounter for general adult medical examination without abnormal findings: Secondary | ICD-10-CM

## 2016-08-27 DIAGNOSIS — N6310 Unspecified lump in the right breast, unspecified quadrant: Secondary | ICD-10-CM

## 2016-08-27 LAB — COMPREHENSIVE METABOLIC PANEL
ALBUMIN: 4.6 g/dL (ref 3.5–5.2)
ALT: 22 U/L (ref 0–35)
AST: 21 U/L (ref 0–37)
Alkaline Phosphatase: 77 U/L (ref 39–117)
BILIRUBIN TOTAL: 1.5 mg/dL — AB (ref 0.2–1.2)
BUN: 15 mg/dL (ref 6–23)
CO2: 29 mEq/L (ref 19–32)
CREATININE: 0.81 mg/dL (ref 0.40–1.20)
Calcium: 9.6 mg/dL (ref 8.4–10.5)
Chloride: 101 mEq/L (ref 96–112)
GFR: 75.66 mL/min (ref >60.00)
Glucose, Bld: 88 mg/dL (ref 70–99)
Potassium: 4.3 mEq/L (ref 3.5–5.1)
SODIUM: 138 meq/L (ref 135–145)
TOTAL PROTEIN: 6.9 g/dL (ref 6.0–8.3)

## 2016-08-27 LAB — LIPID PANEL
CHOLESTEROL: 232 mg/dL — AB (ref 0–200)
HDL: 78.3 mg/dL (ref >39.00)
LDL Cholesterol: 136 mg/dL — ABNORMAL HIGH (ref 0–99)
NonHDL: 153.8
Total CHOL/HDL Ratio: 3
Triglycerides: 87 mg/dL (ref 0.0–149.0)
VLDL: 17.4 mg/dL (ref 0.0–40.0)

## 2016-08-27 LAB — CBC WITH DIFFERENTIAL/PLATELET
BASOS PCT: 0.6 % (ref 0.0–3.0)
Basophils Absolute: 0 10*3/uL (ref 0.0–0.1)
EOS ABS: 0.1 10*3/uL (ref 0.0–0.7)
Eosinophils Relative: 1.7 % (ref 0.0–5.0)
HEMATOCRIT: 44.8 % (ref 36.0–46.0)
HEMOGLOBIN: 15.2 g/dL — AB (ref 12.0–15.0)
LYMPHS PCT: 28 % (ref 12.0–46.0)
Lymphs Abs: 1.8 10*3/uL (ref 0.7–4.0)
MCHC: 34 g/dL (ref 30.0–36.0)
MCV: 87.4 fl (ref 78.0–100.0)
Monocytes Absolute: 0.4 10*3/uL (ref 0.1–1.0)
Monocytes Relative: 6.8 % (ref 3.0–12.0)
Neutro Abs: 4.1 10*3/uL (ref 1.4–7.7)
Neutrophils Relative %: 62.9 % (ref 43.0–77.0)
Platelets: 282 10*3/uL (ref 150.0–400.0)
RBC: 5.12 Mil/uL — ABNORMAL HIGH (ref 3.87–5.11)
RDW: 13.6 % (ref 11.5–15.5)
WBC: 6.6 10*3/uL (ref 4.0–10.5)

## 2016-08-27 LAB — VITAMIN D 25 HYDROXY (VIT D DEFICIENCY, FRACTURES): VITD: 20.59 ng/mL — AB (ref 30.00–100.00)

## 2016-08-27 LAB — TSH: TSH: 2.49 u[IU]/mL (ref 0.35–4.50)

## 2016-08-27 LAB — HEPATITIS C ANTIBODY: HCV AB: NEGATIVE

## 2016-08-27 LAB — HEMOGLOBIN A1C: Hgb A1c MFr Bld: 5.3 % (ref 4.6–6.5)

## 2016-08-27 NOTE — Assessment & Plan Note (Addendum)
Declines flu vaccine. Advised to get Tdap at local pharmacy.Advised to bring by colonoscopy report from [place] from 2011 ( do not have). No longer does Pap smears due to age and preference. Declines pelvic exam today. On breast exam appreciated right breast mass. Have ordered a diagnostic mammogram, and ultrasound. Pneumovax was not given, will be given at followup. Encouraged continued exercise.

## 2016-08-28 ENCOUNTER — Encounter: Payer: Self-pay | Admitting: Family

## 2016-08-28 LAB — HIV ANTIBODY (ROUTINE TESTING W REFLEX): HIV: NONREACTIVE

## 2016-09-01 ENCOUNTER — Telehealth: Payer: Self-pay | Admitting: *Deleted

## 2016-09-01 NOTE — Telephone Encounter (Signed)
Received referral for low dose lung cancer screening CT scan. Voicemail left at phone number listed in EMR for patient to call me back to facilitate scheduling scan.  

## 2016-09-02 ENCOUNTER — Telehealth: Payer: Self-pay | Admitting: *Deleted

## 2016-09-02 DIAGNOSIS — Z87891 Personal history of nicotine dependence: Secondary | ICD-10-CM

## 2016-09-02 NOTE — Telephone Encounter (Signed)
Received referral for initial lung cancer screening scan. Contacted patient and obtained smoking history,(current, 30 pack year) as well as answering questions related to screening process. Patient denies signs of lung cancer such as weight loss or hemoptysis. Patient denies comorbidity that would prevent curative treatment if lung cancer were found. Patient is tentatively scheduled for shared decision making visit and CT scan on 09/08/16 at 1:30pm, pending insurance approval from business office.

## 2016-09-04 NOTE — Progress Notes (Signed)
Left message for patient to call to schedule. 

## 2016-09-04 NOTE — Progress Notes (Signed)
Patient has scheduled nurse visit to receive vaccine.

## 2016-09-08 ENCOUNTER — Encounter: Payer: Self-pay | Admitting: Oncology

## 2016-09-08 ENCOUNTER — Ambulatory Visit
Admission: RE | Admit: 2016-09-08 | Discharge: 2016-09-08 | Disposition: A | Discharge status: Home / Self Care | Payer: BLUE CROSS/BLUE SHIELD | Source: Ambulatory Visit | Attending: Oncology | Admitting: Oncology

## 2016-09-08 ENCOUNTER — Inpatient Hospital Stay: Payer: BLUE CROSS/BLUE SHIELD | Attending: Oncology | Admitting: Oncology

## 2016-09-08 DIAGNOSIS — R918 Other nonspecific abnormal finding of lung field: Secondary | ICD-10-CM | POA: Diagnosis not present

## 2016-09-08 DIAGNOSIS — F17210 Nicotine dependence, cigarettes, uncomplicated: Secondary | ICD-10-CM

## 2016-09-08 DIAGNOSIS — Z87891 Personal history of nicotine dependence: Secondary | ICD-10-CM

## 2016-09-08 DIAGNOSIS — I2510 Atherosclerotic heart disease of native coronary artery without angina pectoris: Secondary | ICD-10-CM | POA: Insufficient documentation

## 2016-09-08 DIAGNOSIS — I700 Atherosclerosis of aorta: Secondary | ICD-10-CM | POA: Diagnosis not present

## 2016-09-08 DIAGNOSIS — Z122 Encounter for screening for malignant neoplasm of respiratory organs: Secondary | ICD-10-CM | POA: Diagnosis not present

## 2016-09-09 ENCOUNTER — Ambulatory Visit
Admission: RE | Admit: 2016-09-09 | Discharge: 2016-09-09 | Disposition: A | Discharge status: Home / Self Care | Payer: BLUE CROSS/BLUE SHIELD | Source: Ambulatory Visit | Attending: Family | Admitting: Family

## 2016-09-09 DIAGNOSIS — Z0000 Encounter for general adult medical examination without abnormal findings: Secondary | ICD-10-CM

## 2016-09-09 DIAGNOSIS — N63 Unspecified lump in unspecified breast: Secondary | ICD-10-CM | POA: Insufficient documentation

## 2016-09-09 DIAGNOSIS — N6310 Unspecified lump in the right breast, unspecified quadrant: Secondary | ICD-10-CM

## 2016-09-10 ENCOUNTER — Ambulatory Visit (INDEPENDENT_AMBULATORY_CARE_PROVIDER_SITE_OTHER): Payer: BLUE CROSS/BLUE SHIELD

## 2016-09-10 ENCOUNTER — Telehealth: Payer: Self-pay | Admitting: *Deleted

## 2016-09-10 ENCOUNTER — Other Ambulatory Visit: Payer: Self-pay | Admitting: Family

## 2016-09-10 DIAGNOSIS — Z23 Encounter for immunization: Secondary | ICD-10-CM

## 2016-09-10 DIAGNOSIS — E785 Hyperlipidemia, unspecified: Secondary | ICD-10-CM

## 2016-09-10 MED ORDER — PRAVASTATIN SODIUM 40 MG PO TABS: 40.0000 mg | ORAL_TABLET | Freq: Every day | ORAL | 2 refills | Status: DC

## 2016-09-10 NOTE — Progress Notes (Signed)
Patient comes in for pneumovax 23. Injected Left arm, patient tolerated well.

## 2016-09-10 NOTE — Telephone Encounter (Signed)
Notified patient of LDCT lung cancer screening results with recommendation for 12 month follow up imaging. Also notified of incidental finding noted below and encouraged to discuss with PCP. Patient verbalizes understanding. This note will be forwarded to PCP via Epic.  IMPRESSION: 1. Lung-RADS Category 2S, benign appearance or behavior. Continue annual screening with low-dose chest CT without contrast in 12 months. 2. The "S" modifier above refers to potentially clinically significant non lung cancer related findings. 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 Please note that although the presence of coronary artery calcium documents the presence of coronary artery disease, the severity of this disease and any potential stenosis cannot be assessed on this non-gated CT examination. Assessment for potential risk factor modification, dietary therapy or pharmacologic therapy may be warranted, if clinically indicated.

## 2016-09-10 NOTE — Telephone Encounter (Signed)
noted 

## 2016-09-11 DIAGNOSIS — Z87891 Personal history of nicotine dependence: Secondary | ICD-10-CM | POA: Insufficient documentation

## 2016-09-11 NOTE — Progress Notes (Signed)
In accordance with CMS guidelines, patient has met eligibility criteria including age, absence of signs or symptoms of lung cancer.  Social History  Substance Use Topics  . Smoking status: Current Every Day Smoker    Packs/day: 1.00    Years: 30.00    Types: Cigarettes    Last attempt to quit: 08/03/2009  . Smokeless tobacco: Never Used  . Alcohol use 0.0 oz/week     Comment: Glass wine hs     A shared decision-making session was conducted prior to the performance of CT scan. This includes one or more decision aids, includes benefits and harms of screening, follow-up diagnostic testing, over-diagnosis, false positive rate, and total radiation exposure.  Counseling on the importance of adherence to annual lung cancer LDCT screening, impact of co-morbidities, and ability or willingness to undergo diagnosis and treatment is imperative for compliance of the program.  Counseling on the importance of continued smoking cessation for former smokers; the importance of smoking cessation for current smokers, and information about tobacco cessation interventions have been given to patient including [place] and [facility] quit [place] programs.  Written order for lung cancer screening with LDCT has been given to the patient and any and all questions have been answered to the best of my abilities.   Yearly follow up will be coordinated by Burgess Estelle, Thoracic Navigator.

## 2016-10-29 ENCOUNTER — Other Ambulatory Visit: Payer: Self-pay | Admitting: Family Medicine

## 2017-04-26 ENCOUNTER — Other Ambulatory Visit: Payer: Self-pay | Admitting: Family

## 2017-06-06 ENCOUNTER — Other Ambulatory Visit: Payer: Self-pay | Admitting: Family

## 2017-06-06 DIAGNOSIS — E785 Hyperlipidemia, unspecified: Secondary | ICD-10-CM

## 2017-06-10 ENCOUNTER — Encounter: Payer: Self-pay | Admitting: Family

## 2017-07-05 NOTE — Telephone Encounter (Signed)
See Note. Thank you!

## 2017-07-21 ENCOUNTER — Other Ambulatory Visit: Payer: Self-pay | Admitting: Family

## 2017-08-30 ENCOUNTER — Encounter: Payer: BLUE CROSS/BLUE SHIELD | Admitting: Family

## 2017-09-08 ENCOUNTER — Telehealth: Payer: Self-pay | Admitting: *Deleted

## 2017-09-08 DIAGNOSIS — Z87891 Personal history of nicotine dependence: Secondary | ICD-10-CM

## 2017-09-08 DIAGNOSIS — Z122 Encounter for screening for malignant neoplasm of respiratory organs: Secondary | ICD-10-CM

## 2017-09-08 NOTE — Telephone Encounter (Signed)
Notified patient that annual lung cancer screening low dose CT scan is due currently or will be in near future. Confirmed that patient is within the age range of 55-77, and asymptomatic, (no signs or symptoms of lung cancer). Patient denies illness that would prevent curative treatment for lung cancer if found. Verified smoking history, (current, 30.75 pack year). The shared decision making visit was done 09/08/16. Patient is agreeable for CT scan being scheduled.

## 2017-09-13 ENCOUNTER — Telehealth: Payer: Self-pay | Admitting: Family

## 2017-09-13 DIAGNOSIS — E785 Hyperlipidemia, unspecified: Secondary | ICD-10-CM

## 2017-09-13 MED ORDER — LEVOTHYROXINE SODIUM 100 MCG PO TABS: ORAL_TABLET | ORAL | 0 refills | Status: DC

## 2017-09-13 MED ORDER — PRAVASTATIN SODIUM 40 MG PO TABS: ORAL_TABLET | ORAL | 0 refills | Status: DC

## 2017-09-13 NOTE — Telephone Encounter (Signed)
Pt needs all Rx to Now go to [facility]   Pt needs a refill on pravastatin (PRAVACHOL) 40 MG tablet And needs a new Rx on levothyroxine (SYNTHROID, LEVOTHROID) 100 MCG tablet to go to [facility] as well   Please advise

## 2017-09-13 NOTE — Telephone Encounter (Signed)
Patient must keep appointment to receive further refills.

## 2017-09-23 ENCOUNTER — Ambulatory Visit
Admission: RE | Admit: 2017-09-23 | Discharge: 2017-09-23 | Disposition: A | Discharge status: Home / Self Care | Payer: Medicare HMO | Source: Ambulatory Visit | Attending: Nurse Practitioner | Admitting: Nurse Practitioner

## 2017-09-23 DIAGNOSIS — J438 Other emphysema: Secondary | ICD-10-CM | POA: Diagnosis not present

## 2017-09-23 DIAGNOSIS — J432 Centrilobular emphysema: Secondary | ICD-10-CM | POA: Insufficient documentation

## 2017-09-23 DIAGNOSIS — I700 Atherosclerosis of aorta: Secondary | ICD-10-CM | POA: Diagnosis not present

## 2017-09-23 DIAGNOSIS — Z122 Encounter for screening for malignant neoplasm of respiratory organs: Secondary | ICD-10-CM | POA: Diagnosis not present

## 2017-09-23 DIAGNOSIS — Z87891 Personal history of nicotine dependence: Secondary | ICD-10-CM | POA: Diagnosis not present

## 2017-09-23 DIAGNOSIS — R69 Illness, unspecified: Secondary | ICD-10-CM | POA: Diagnosis not present

## 2017-09-27 ENCOUNTER — Encounter: Payer: Self-pay | Admitting: *Deleted

## 2017-09-30 ENCOUNTER — Encounter: Payer: Self-pay | Admitting: Family

## 2017-09-30 ENCOUNTER — Telehealth: Payer: Self-pay | Admitting: Family

## 2017-09-30 DIAGNOSIS — I700 Atherosclerosis of aorta: Secondary | ICD-10-CM | POA: Insufficient documentation

## 2017-09-30 NOTE — Telephone Encounter (Signed)
Please mail letter to patient:   Tara Coleman, Tara Coleman you are doing well.   I received results from your annual CT lung scan from [facility].  It was noted on the exam that you have coronary artery atherosclerosis, or often referred to as hardening of the arteries.   This can put you at risk for stroke, heart attack.   Please make a follow up appointment with me so we can discuss lifestyle changes and ensure the pravachol is working well. It has been a year since your last cholesterol panel.   Look forward to seeing you!   Best,   Mable Paris, NP

## 2017-10-01 NOTE — Telephone Encounter (Signed)
Letter printed and mailed.  

## 2017-10-04 ENCOUNTER — Encounter: Payer: BLUE CROSS/BLUE SHIELD | Admitting: Family

## 2017-11-10 ENCOUNTER — Encounter: Payer: Self-pay | Admitting: Family

## 2017-11-10 ENCOUNTER — Ambulatory Visit (INDEPENDENT_AMBULATORY_CARE_PROVIDER_SITE_OTHER): Payer: Medicare HMO | Admitting: Family

## 2017-11-10 VITALS — BP 122/84 | HR 72 | Temp 98.2°F | Resp 15 | Ht 67.0 in | Wt 190.8 lb

## 2017-11-10 DIAGNOSIS — Z23 Encounter for immunization: Secondary | ICD-10-CM | POA: Diagnosis not present

## 2017-11-10 DIAGNOSIS — I700 Atherosclerosis of aorta: Secondary | ICD-10-CM | POA: Diagnosis not present

## 2017-11-10 DIAGNOSIS — Z780 Asymptomatic menopausal state: Secondary | ICD-10-CM

## 2017-11-10 DIAGNOSIS — E785 Hyperlipidemia, unspecified: Secondary | ICD-10-CM | POA: Diagnosis not present

## 2017-11-10 DIAGNOSIS — Z0000 Encounter for general adult medical examination without abnormal findings: Secondary | ICD-10-CM | POA: Diagnosis not present

## 2017-11-10 MED ORDER — PRAVASTATIN SODIUM 40 MG PO TABS: ORAL_TABLET | ORAL | 1 refills | Status: DC

## 2017-11-10 NOTE — Progress Notes (Signed)
Subjective:    Patient ID: Tara Coleman, female    DOB: Oct 02, 1950, 67 y.o.   MRN: [id number]  CC: Tara Coleman is a 66 y.o. female who presents today for physical exam.    HPI: Atherosclerosis- off of the cholesterol medication one month ago; had joint pain.  Otherwise, feels very well today.  No complaints     Colorectal Cancer Screening: UTD 2011; states had 2017 per patient. No records of this one in 2017.  Breast Cancer Screening: Mammogram 08/2016; target [place] of right breast 2018 was normal; due Cervical Cancer Screening: declines; normal 7 years ago at 9. No pelvic pain Bone Health screening/DEXA for 65+: due Lung Cancer Screening: BIRADS 2 09/2017.  Immunizations       Tetanus - utd        Pneumococcal - pneumococcal 68 before age 12. Due for prevnar  Labs: Screening labs today. 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  She also plays tennis twice a week Alcohol use: moderate Smoking/tobacco use: smoker.  Regular dental exams: UTD Wears seat belt: Yes. Skin: [place]; no h/o skin cancer.  HISTORY:  Past Medical History:  Diagnosis Date  . Thyroid disease     Past Surgical History:  Procedure Laterality Date  . ADENOIDECTOMY     as a child  . CESAREAN SECTION     x2    Family History  Problem Relation Age of Onset  . Cancer Mother 32       biliary duct  . Thyroid disease Sister   . Psoriasis Son   . Colon cancer Maternal Grandfather        lived to 75  . Breast cancer Neg Hx       ALLERGIES: Patient has no known allergies.  Current Outpatient Medications on File Prior to Visit  Medication Sig Dispense Refill  . levothyroxine (SYNTHROID, LEVOTHROID) 100 MCG tablet TAKE 1 TABLET BY MOUTH EVERY MORNING ON AN EMPTY STOMACH 90 tablet 0   No current facility-administered medications on file prior to visit.     Social History   Tobacco Use  . Smoking status: Current Some Day Smoker   Packs/day: 1.00    Years: 30.00    Pack years: 30.00    Types: Cigarettes    Last attempt to quit: 08/03/2009    Years since quitting: 8.2  . Smokeless tobacco: Never Used  Substance Use Topics  . Alcohol use: Yes    Alcohol/week: 0.0 oz    Comment: Glass wine hs  . Drug use: No    Review of Systems  Constitutional: Negative for chills, fever and unexpected weight change.  HENT: Negative for congestion.   Respiratory: Negative for cough.   Cardiovascular: Negative for chest pain, palpitations and leg swelling.  Gastrointestinal: Negative for nausea and vomiting.  Genitourinary: Negative for pelvic pain and vaginal bleeding.  Musculoskeletal: Negative for arthralgias and myalgias.  Skin: Negative for rash.  Neurological: Negative for headaches.  Hematological: Negative for adenopathy.  Psychiatric/Behavioral: Negative for confusion.      Objective:    BP 122/84 (BP Location: Left Arm, Patient Position: Sitting, Cuff Size: Normal)   Pulse 72   Temp 98.2 F (36.8 C) (Oral)   Resp 15   Ht 5\' 7"  (1.702 m)   Wt 190 lb 12 oz (86.5 kg)   SpO2 97%   BMI 29.88 kg/m   BP Readings from Last 3 Encounters:  11/10/17 122/84  08/26/16  126/66  09/24/15 110/71   Wt Readings from Last 3 Encounters:  11/10/17 190 lb 12 oz (86.5 kg)  09/23/17 185 lb (83.9 kg)  09/08/16 175 lb (79.4 kg)    Physical Exam  Constitutional: She appears well-developed and well-nourished.  Eyes: Conjunctivae are normal.  Neck: No thyroid mass and no thyromegaly present.  Cardiovascular: Normal rate, regular rhythm, normal heart sounds and normal pulses.  Pulmonary/Chest: Effort normal and breath sounds normal. She has no wheezes. She has no rhonchi. She has no rales. Right breast exhibits no inverted nipple, no mass, no nipple discharge, no skin change and no tenderness. Left breast exhibits no inverted nipple, no mass, no nipple discharge, no skin change and no tenderness. Breasts are symmetrical.  CBE  performed.   Lymphadenopathy:       Head (right side): No submental, no submandibular, no tonsillar, no preauricular, no posterior auricular and no occipital adenopathy present.       Head (left side): No submental, no submandibular, no tonsillar, no preauricular, no posterior auricular and no occipital adenopathy present.    She has no cervical adenopathy.       Right cervical: No superficial cervical, no deep cervical and no posterior cervical adenopathy present.      Left cervical: No superficial cervical, no deep cervical and no posterior cervical adenopathy present.    She has no axillary adenopathy.  Neurological: She is alert.  Skin: Skin is warm and dry.  Psychiatric: She has a normal mood and affect. Her speech is normal and behavior is normal. Thought content normal.  Vitals reviewed.      Assessment & Plan:   Problem List Items Addressed This Visit      Cardiovascular and Mediastinum   Atherosclerosis of aorta (HCC)    Of cholesterol medication now. Agreeable to starting once per week. Will follow.       Relevant Medications   pravastatin (PRAVACHOL) 40 MG tablet     Other   Routine general medical examination at a health care facility - Primary    DEXA ordered today.  Patient will continue to do CT chest scan for smoking.CBE performed today. Long discussion with patient about guidelines for Pap and when to stop.  Patient had last Pap smear which was normal at 67 years old, explained to her my recommendation would be to continue Pap smear until the age of 49.  At this time, patient has no pelvic concerns or complaints, and she politely declines having a Pap smear today.  She would like to not pursue a Pap smear in the future unless she has any complaints.  Mammogram has been ordered and patient will schedule.  We have requested medical records of her colonoscopy as she states in 2017.      Relevant Medications   pravastatin (PRAVACHOL) 40 MG tablet   Other Relevant Orders    Comprehensive metabolic panel   Hemoglobin A1c   CBC with Differential/Platelet   Lipid panel   TSH   VITAMIN D 25 Hydroxy (Vit-D Deficiency, Fractures)   MM SCREENING BREAST TOMO BILATERAL   DG Bone Density    Other Visit Diagnoses    Post-menopausal       Hyperlipidemia, unspecified hyperlipidemia type       Relevant Medications   pravastatin (PRAVACHOL) 40 MG tablet   Need for pneumococcal vaccination       Relevant Orders   Pneumococcal conjugate vaccine 13-valent IM (Completed)       I am  having Enzley L. Hartland maintain her levothyroxine and pravastatin.   Meds ordered this encounter  Medications  . pravastatin (PRAVACHOL) 40 MG tablet    Sig: TAKE 1 TABLET(40 MG) BY MOUTH DAILY    Dispense:  90 tablet    Refill:  1    Order Specific Question:   Supervising Provider    Answer:   Crecencio Mc [[id number]]    Return precautions given.   Risks, benefits, and alternatives of the medications and treatment plan prescribed today were discussed, and patient expressed understanding.   Education regarding symptom management and diagnosis given to patient on AVS.   Continue to follow with Burnard Hawthorne, FNP for routine health maintenance.   Marqueta L Hauck and I agreed with plan.   Mable Paris, FNP

## 2017-11-10 NOTE — Patient Instructions (Addendum)
Start pravachol once per week and then increase from there.   We do not have results of colonoscopy and will request  Shingrex series at local pharmacy.  Labs when fasting.   We placed a referral for mammogram this year. I asked that you call one the below locations and schedule this when it is convenient for you.   As discussed, I would like you to ask for 3D mammogram over the traditional 2D mammogram as new evidence suggest 3D is superior.   Please note that NOT all insurance companies cover 3D and you may have to pay a higher copay. You may call your insurance company to further clarify your benefits.   Options for [place]  [place], [place]  * [facility] [place] [place] * Note if you ask for 3D mammogram at this location, you must request [place], [place] location*      Health Maintenance for Postmenopausal Women Menopause is a normal process in which your reproductive ability comes to an end. This process happens gradually over a span of months to years, usually between the ages of 2 and 86. Menopause is complete when you have missed 12 consecutive menstrual periods. It is important to talk with your health care provider about some of the most common conditions that affect postmenopausal women, such as heart disease, cancer, and bone loss (osteoporosis). Adopting a healthy lifestyle and getting preventive care can help to promote your health and wellness. Those actions can also lower your chances of developing some of these common conditions. What should I know about menopause? During menopause, you may experience a number of symptoms, such as:  Moderate-to-severe hot flashes.  Night sweats.  Decrease in sex drive.  Mood swings.  Headaches.  Tiredness.  Irritability.  Memory  problems.  Insomnia.  Choosing to treat or not to treat menopausal changes is an individual decision that you make with your health care provider. What should I know about hormone replacement therapy and supplements? Hormone therapy products are effective for treating symptoms that are associated with menopause, such as hot flashes and night sweats. Hormone replacement carries certain risks, especially as you become older. If you are thinking about using estrogen or estrogen with progestin treatments, discuss the benefits and risks with your health care provider. What should I know about heart disease and stroke? Heart disease, heart attack, and stroke become more likely as you age. This may be due, in part, to the hormonal changes that your body experiences during menopause. These can affect how your body processes dietary fats, triglycerides, and cholesterol. Heart attack and stroke are both medical emergencies. There are many things that you can do to help prevent heart disease and stroke:  Have your blood pressure checked at least every 1-2 years. High blood pressure causes heart disease and increases the risk of stroke.  If you are 65-52 years old, ask your health care provider if you should take aspirin to prevent a heart attack or a stroke.  Do not use any tobacco products, including cigarettes, chewing tobacco, or electronic cigarettes. If you need help quitting, ask your health care provider.  It is important to eat a healthy diet and maintain a healthy weight. ? Be sure to include plenty of vegetables, fruits, low-fat dairy products, and lean protein. ? Avoid eating foods that are high in solid fats,  added sugars, or salt (sodium).  Get regular exercise. This is one of the most important things that you can do for your health. ? Try to exercise for at least 150 minutes each week. The type of exercise that you do should increase your heart rate and make you sweat. This is known as  moderate-intensity exercise. ? Try to do strengthening exercises at least twice each week. Do these in addition to the moderate-intensity exercise.  Know your numbers.Ask your health care provider to check your cholesterol and your blood glucose. Continue to have your blood tested as directed by your health care provider.  What should I know about cancer screening? There are several types of cancer. Take the following steps to reduce your risk and to catch any cancer development as early as possible. Breast Cancer  Practice breast self-awareness. ? This means understanding how your breasts normally appear and feel. ? It also means doing regular breast self-exams. Let your health care provider know about any changes, no matter how small.  If you are 60 or older, have a clinician do a breast exam (clinical breast exam or CBE) every year. Depending on your age, family history, and medical history, it may be recommended that you also have a yearly breast X-ray (mammogram).  If you have a family history of breast cancer, talk with your health care provider about genetic screening.  If you are at high risk for breast cancer, talk with your health care provider about having an MRI and a mammogram every year.  Breast cancer (BRCA) gene test is recommended for women who have family members with BRCA-related cancers. Results of the assessment will determine the need for genetic counseling and BRCA1 and for BRCA2 testing. BRCA-related cancers include these types: ? Breast. This occurs in males or females. ? Ovarian. ? Tubal. This may also be called fallopian tube cancer. ? Cancer of the abdominal or pelvic lining (peritoneal cancer). ? Prostate. ? Pancreatic.  Cervical, Uterine, and Ovarian Cancer Your health care provider may recommend that you be screened regularly for cancer of the pelvic organs. These include your ovaries, uterus, and vagina. This screening involves a pelvic exam, which  includes checking for microscopic changes to the surface of your cervix (Pap test).  For women ages 21-65, health care providers may recommend a pelvic exam and a Pap test every three years. For women ages 41-65, they may recommend the Pap test and pelvic exam, combined with testing for human papilloma virus (HPV), every five years. Some types of HPV increase your risk of cervical cancer. Testing for HPV may also be done on women of any age who have unclear Pap test results.  Other health care providers may not recommend any screening for nonpregnant women who are considered low risk for pelvic cancer and have no symptoms. Ask your health care provider if a screening pelvic exam is right for you.  If you have had past treatment for cervical cancer or a condition that could lead to cancer, you need Pap tests and screening for cancer for at least 20 years after your treatment. If Pap tests have been discontinued for you, your risk factors (such as having a new sexual partner) need to be reassessed to determine if you should start having screenings again. Some women have medical problems that increase the chance of getting cervical cancer. In these cases, your health care provider may recommend that you have screening and Pap tests more often.  If you have a family  history of uterine cancer or ovarian cancer, talk with your health care provider about genetic screening.  If you have vaginal bleeding after reaching menopause, tell your health care provider.  There are currently no reliable tests available to screen for ovarian cancer.  Lung Cancer Lung cancer screening is recommended for adults 79-73 years old who are at high risk for lung cancer because of a history of smoking. A yearly low-dose CT scan of the lungs is recommended if you:  Currently smoke.  Have a history of at least 30 pack-years of smoking and you currently smoke or have quit within the past 15 years. A pack-year is smoking an  average of one pack of cigarettes per day for one year.  Yearly screening should:  Continue until it has been 15 years since you quit.  Stop if you develop a health problem that would prevent you from having lung cancer treatment.  Colorectal Cancer  This type of cancer can be detected and can often be prevented.  Routine colorectal cancer screening usually begins at age 14 and continues through age 45.  If you have risk factors for colon cancer, your health care provider may recommend that you be screened at an earlier age.  If you have a family history of colorectal cancer, talk with your health care provider about genetic screening.  Your health care provider may also recommend using home test kits to check for hidden blood in your stool.  A small camera at the end of a tube can be used to examine your colon directly (sigmoidoscopy or colonoscopy). This is done to check for the earliest forms of colorectal cancer.  Direct examination of the colon should be repeated every 5-10 years until age 1. However, if early forms of precancerous polyps or small growths are found or if you have a family history or genetic risk for colorectal cancer, you may need to be screened more often.  Skin Cancer  Check your skin from head to toe regularly.  Monitor any moles. Be sure to tell your health care provider: ? About any new moles or changes in moles, especially if there is a change in a mole's shape or color. ? If you have a mole that is larger than the size of a pencil eraser.  If any of your family members has a history of skin cancer, especially at a young age, talk with your health care provider about genetic screening.  Always use sunscreen. Apply sunscreen liberally and repeatedly throughout the day.  Whenever you are outside, protect yourself by wearing long sleeves, pants, a wide-brimmed hat, and sunglasses.  What should I know about osteoporosis? Osteoporosis is a condition in  which bone destruction happens more quickly than new bone creation. After menopause, you may be at an increased risk for osteoporosis. To help prevent osteoporosis or the bone fractures that can happen because of osteoporosis, the following is recommended:  If you are 60-65 years old, get at least 1,000 mg of calcium and at least 600 mg of vitamin D per day.  If you are older than age 33 but younger than age 81, get at least 1,200 mg of calcium and at least 600 mg of vitamin D per day.  If you are older than age 40, get at least 1,200 mg of calcium and at least 800 mg of vitamin D per day.  Smoking and excessive alcohol intake increase the risk of osteoporosis. Eat foods that are rich in calcium and vitamin D,  and do weight-bearing exercises several times each week as directed by your health care provider. What should I know about how menopause affects my mental health? Depression may occur at any age, but it is more common as you become older. Common symptoms of depression include:  Low or sad mood.  Changes in sleep patterns.  Changes in appetite or eating patterns.  Feeling an overall lack of motivation or enjoyment of activities that you previously enjoyed.  Frequent crying spells.  Talk with your health care provider if you think that you are experiencing depression. What should I know about immunizations? It is important that you get and maintain your immunizations. These include:  Tetanus, diphtheria, and pertussis (Tdap) booster vaccine.  Influenza every year before the flu season begins.  Pneumonia vaccine.  Shingles vaccine.  Your health care provider may also recommend other immunizations. This information is not intended to replace advice given to you by your health care provider. Make sure you discuss any questions you have with your health care provider. Document Released: 09/25/2005 Document Revised: 02/21/2016 Document Reviewed: 05/07/2015 Elsevier Interactive  Patient Education  2018 [employer].

## 2017-11-10 NOTE — Assessment & Plan Note (Signed)
Of cholesterol medication now. Agreeable to starting once per week. Will follow.

## 2017-11-10 NOTE — Assessment & Plan Note (Addendum)
DEXA ordered today.  Patient will continue to do CT chest scan for smoking.CBE performed today. Long discussion with patient about guidelines for Pap and when to stop.  Patient had last Pap smear which was normal at 67 years old, explained to her my recommendation would be to continue Pap smear until the age of 72.  At this time, patient has no pelvic concerns or complaints, and she politely declines having a Pap smear today.  She would like to not pursue a Pap smear in the future unless she has any complaints.  Mammogram has been ordered and patient will schedule.  We have requested medical records of her colonoscopy as she states in 2017.

## 2017-11-12 ENCOUNTER — Other Ambulatory Visit (INDEPENDENT_AMBULATORY_CARE_PROVIDER_SITE_OTHER): Payer: Medicare HMO

## 2017-11-12 DIAGNOSIS — Z0000 Encounter for general adult medical examination without abnormal findings: Secondary | ICD-10-CM | POA: Diagnosis not present

## 2017-11-12 LAB — CBC WITH DIFFERENTIAL/PLATELET
BASOS ABS: 0 10*3/uL (ref 0.0–0.1)
Basophils Relative: 0.7 % (ref 0.0–3.0)
Eosinophils Absolute: 0.1 10*3/uL (ref 0.0–0.7)
Eosinophils Relative: 2.2 % (ref 0.0–5.0)
HCT: 43.4 % (ref 36.0–46.0)
Hemoglobin: 14.5 g/dL (ref 12.0–15.0)
Lymphocytes Relative: 20.4 % (ref 12.0–46.0)
Lymphs Abs: 1.4 10*3/uL (ref 0.7–4.0)
MCHC: 33.5 g/dL (ref 30.0–36.0)
MCV: 89.3 fl (ref 78.0–100.0)
MONOS PCT: 8.8 % (ref 3.0–12.0)
Monocytes Absolute: 0.6 10*3/uL (ref 0.1–1.0)
NEUTROS ABS: 4.7 10*3/uL (ref 1.4–7.7)
NEUTROS PCT: 67.9 % (ref 43.0–77.0)
Platelets: 282 10*3/uL (ref 150.0–400.0)
RBC: 4.85 Mil/uL (ref 3.87–5.11)
RDW: 14.1 % (ref 11.5–15.5)
WBC: 6.9 10*3/uL (ref 4.0–10.5)

## 2017-11-12 LAB — LIPID PANEL
CHOLESTEROL: 169 mg/dL (ref 0–200)
HDL: 64.9 mg/dL (ref >39.00)
LDL Cholesterol: 92 mg/dL (ref 0–99)
NONHDL: 104.24
Total CHOL/HDL Ratio: 3
Triglycerides: 61 mg/dL (ref 0.0–149.0)
VLDL: 12.2 mg/dL (ref 0.0–40.0)

## 2017-11-12 LAB — COMPREHENSIVE METABOLIC PANEL
ALT: 12 U/L (ref 0–35)
AST: 18 U/L (ref 0–37)
Albumin: 4 g/dL (ref 3.5–5.2)
Alkaline Phosphatase: 73 U/L (ref 39–117)
BILIRUBIN TOTAL: 1 mg/dL (ref 0.2–1.2)
BUN: 9 mg/dL (ref 6–23)
CO2: 29 meq/L (ref 19–32)
CREATININE: 0.78 mg/dL (ref 0.40–1.20)
Calcium: 9.2 mg/dL (ref 8.4–10.5)
Chloride: 103 mEq/L (ref 96–112)
GFR: 78.73 mL/min (ref >60.00)
Glucose, Bld: 104 mg/dL — ABNORMAL HIGH (ref 70–99)
Potassium: 4.4 mEq/L (ref 3.5–5.1)
SODIUM: 138 meq/L (ref 135–145)
TOTAL PROTEIN: 6.9 g/dL (ref 6.0–8.3)

## 2017-11-12 LAB — HEMOGLOBIN A1C: HEMOGLOBIN A1C: 5.5 % (ref 4.6–6.5)

## 2017-11-12 LAB — TSH: TSH: 0.42 u[IU]/mL (ref 0.35–4.50)

## 2017-11-12 LAB — VITAMIN D 25 HYDROXY (VIT D DEFICIENCY, FRACTURES): VITD: 23.59 ng/mL — AB (ref 30.00–100.00)

## 2017-12-21 DIAGNOSIS — R69 Illness, unspecified: Secondary | ICD-10-CM | POA: Diagnosis not present

## 2017-12-23 ENCOUNTER — Other Ambulatory Visit: Payer: Self-pay | Admitting: Family

## 2018-01-04 ENCOUNTER — Ambulatory Visit
Admission: RE | Admit: 2018-01-04 | Discharge: 2018-01-04 | Disposition: A | Discharge status: Home / Self Care | Payer: Medicare HMO | Source: Ambulatory Visit | Attending: Family | Admitting: Family

## 2018-01-04 DIAGNOSIS — Z780 Asymptomatic menopausal state: Secondary | ICD-10-CM | POA: Diagnosis not present

## 2018-01-04 DIAGNOSIS — M85852 Other specified disorders of bone density and structure, left thigh: Secondary | ICD-10-CM | POA: Insufficient documentation

## 2018-01-04 DIAGNOSIS — Z1231 Encounter for screening mammogram for malignant neoplasm of breast: Secondary | ICD-10-CM | POA: Insufficient documentation

## 2018-01-04 DIAGNOSIS — M8588 Other specified disorders of bone density and structure, other site: Secondary | ICD-10-CM | POA: Insufficient documentation

## 2018-01-04 DIAGNOSIS — Z13820 Encounter for screening for osteoporosis: Secondary | ICD-10-CM | POA: Diagnosis not present

## 2018-01-04 DIAGNOSIS — Z0000 Encounter for general adult medical examination without abnormal findings: Secondary | ICD-10-CM

## 2018-01-04 DIAGNOSIS — M8589 Other specified disorders of bone density and structure, multiple sites: Secondary | ICD-10-CM | POA: Diagnosis not present

## 2018-03-07 DIAGNOSIS — D2230 Melanocytic nevi of unspecified part of face: Secondary | ICD-10-CM | POA: Diagnosis not present

## 2018-03-07 DIAGNOSIS — D485 Neoplasm of uncertain behavior of skin: Secondary | ICD-10-CM | POA: Diagnosis not present

## 2018-03-07 DIAGNOSIS — L812 Freckles: Secondary | ICD-10-CM | POA: Diagnosis not present

## 2018-03-07 DIAGNOSIS — L719 Rosacea, unspecified: Secondary | ICD-10-CM | POA: Diagnosis not present

## 2018-03-07 DIAGNOSIS — L578 Other skin changes due to chronic exposure to nonionizing radiation: Secondary | ICD-10-CM | POA: Diagnosis not present

## 2018-03-07 DIAGNOSIS — D692 Other nonthrombocytopenic purpura: Secondary | ICD-10-CM | POA: Diagnosis not present

## 2018-03-07 DIAGNOSIS — D229 Melanocytic nevi, unspecified: Secondary | ICD-10-CM | POA: Diagnosis not present

## 2018-03-07 DIAGNOSIS — Z1283 Encounter for screening for malignant neoplasm of skin: Secondary | ICD-10-CM | POA: Diagnosis not present

## 2018-03-07 DIAGNOSIS — D225 Melanocytic nevi of trunk: Secondary | ICD-10-CM | POA: Diagnosis not present

## 2018-03-07 DIAGNOSIS — L821 Other seborrheic keratosis: Secondary | ICD-10-CM | POA: Diagnosis not present

## 2018-04-28 ENCOUNTER — Ambulatory Visit (INDEPENDENT_AMBULATORY_CARE_PROVIDER_SITE_OTHER): Payer: Medicare HMO | Admitting: Internal Medicine

## 2018-04-28 ENCOUNTER — Encounter: Payer: Self-pay | Admitting: Internal Medicine

## 2018-04-28 VITALS — BP 130/72 | HR 67 | Temp 98.2°F | Ht 67.0 in | Wt 187.0 lb

## 2018-04-28 DIAGNOSIS — J441 Chronic obstructive pulmonary disease with (acute) exacerbation: Secondary | ICD-10-CM | POA: Diagnosis not present

## 2018-04-28 DIAGNOSIS — J209 Acute bronchitis, unspecified: Secondary | ICD-10-CM | POA: Insufficient documentation

## 2018-04-28 DIAGNOSIS — R062 Wheezing: Secondary | ICD-10-CM | POA: Diagnosis not present

## 2018-04-28 MED ORDER — METHYLPREDNISOLONE ACETATE 40 MG/ML IJ SUSP
40.0000 mg | Freq: Once | INTRAMUSCULAR | Status: AC
  Administered 2018-04-28: 40 mg via INTRAMUSCULAR

## 2018-04-28 MED ORDER — AZITHROMYCIN 250 MG PO TABS: ORAL_TABLET | ORAL | 0 refills | Status: DC

## 2018-04-28 MED ORDER — ALBUTEROL SULFATE (2.5 MG/3ML) 0.083% IN NEBU
2.5000 mg | INHALATION_SOLUTION | Freq: Once | RESPIRATORY_TRACT | Status: AC
  Administered 2018-04-28: 2.5 mg via RESPIRATORY_TRACT

## 2018-04-28 MED ORDER — IPRATROPIUM BROMIDE 0.02 % IN SOLN
0.5000 mg | Freq: Once | RESPIRATORY_TRACT | Status: AC
  Administered 2018-04-28: 0.5 mg via RESPIRATORY_TRACT

## 2018-04-28 MED ORDER — ALBUTEROL SULFATE HFA 108 (90 BASE) MCG/ACT IN AERS: 1.0000 | INHALATION_SPRAY | Freq: Four times a day (QID) | RESPIRATORY_TRACT | 2 refills | Status: DC | PRN

## 2018-04-28 MED ORDER — PREDNISONE 20 MG PO TABS
40.0000 mg | ORAL_TABLET | Freq: Every day | ORAL | 0 refills | Status: DC
Start: 2018-04-28 — End: 2018-09-05

## 2018-04-28 NOTE — Patient Instructions (Signed)
Call back by Monday if not better   Chronic Obstructive Pulmonary Disease Exacerbation Chronic obstructive pulmonary disease (COPD) is a common lung problem. In COPD, the flow of air from the lungs is limited. COPD exacerbations are times that breathing gets worse and you need extra treatment. Without treatment they can be life threatening. If they happen often, your lungs can become more damaged. If your COPD gets worse, your doctor may treat you with:  Medicines.  Oxygen.  Different ways to clear your airway, such as using a mask.  Follow these instructions at home:  Do not smoke.  Avoid tobacco smoke and other things that bother your lungs.  If given, take your antibiotic medicine as told. Finish the medicine even if you start to feel better.  Only take medicines as told by your doctor.  Drink enough fluids to keep your pee (urine) clear or pale yellow (unless your doctor has told you not to).  Use a cool mist machine (vaporizer).  If you use oxygen or a machine that turns liquid medicine into a mist (nebulizer), continue to use them as told.  Keep up with shots (vaccinations) as told by your doctor.  Exercise regularly.  Eat healthy foods.  Keep all doctor visits as told. Get help right away if:  You are very short of breath and it gets worse.  You have trouble talking.  You have bad chest pain.  You have blood in your spit (sputum).  You have a fever.  You keep throwing up (vomiting).  You feel weak, or you pass out (faint).  You feel confused.  You keep getting worse. This information is not intended to replace advice given to you by your health care provider. Make sure you discuss any questions you have with your health care provider. Document Released: 07/23/2011 Document Revised: 01/09/2016 Document Reviewed: 04/07/2013 Elsevier Interactive Patient Education  2017 [employer].

## 2018-04-28 NOTE — Progress Notes (Signed)
Chief Complaint  Patient presents with  . URI   Sick visit  C/o cough, sob, wheezing x few days she is smoker and has been around sick contacts CT 09/2017 +COPD. Tried OTC meds mucinex nyquil w/o relief    Review of Systems  Constitutional: Negative for fever.  HENT: Negative for hearing loss.        +PND  Respiratory: Positive for cough and wheezing.   Cardiovascular: Negative for chest pain.  Skin: Negative for rash.   Past Medical History:  Diagnosis Date  . Thyroid disease    Past Surgical History:  Procedure Laterality Date  . ADENOIDECTOMY     as a child  . CESAREAN SECTION     x2    Family History  Problem Relation Age of Onset  . Cancer Mother 55       biliary duct  . Thyroid disease Sister   . Psoriasis Son   . Colon cancer Maternal Grandfather        lived to 52  . Breast cancer Neg Hx    Social History   Socioeconomic History  . Marital status: Married    Spouse name: Not on file  . Number of children: 2  . Years of education: Not on file  . Highest education level: Not on file  Occupational History  . 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 Financial resource strain: Not on file  . Food insecurity:    Worry: Not on file    Inability: Not on file  . Transportation needs:    Medical: Not on file    Non-medical: Not on file  Tobacco Use  . Smoking status: Current Some Day Smoker    Packs/day: 1.00    Years: 30.00    Pack years: 30.00    Types: Cigarettes    Last attempt to quit: 08/03/2009    Years since quitting: 8.7  . Smokeless tobacco: Never Used  Substance and Sexual Activity  . Alcohol use: Yes    Alcohol/week: 0.0 standard drinks    Comment: Glass wine hs  . Drug use: No  . Sexual activity: Not on file  Lifestyle  . Physical activity:    Days per week: Not on file    Minutes per session: Not on file  . Stress: Not on file  Relationships  . Social connections:    Talks on phone: Not on file    Gets together: Not  on file    Attends religious service: Not on file    Active member of club or organization: Not on file    Attends meetings of clubs or organizations: Not on file    Relationship status: Not on file  . Intimate partner violence:    Fear of current or ex partner: Not on file    Emotionally abused: Not on file    Physically abused: Not on file    Forced sexual activity: Not on file  Other Topics Concern  . 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 levothyroxine (SYNTHROID, LEVOTHROID) 100 MCG tablet TAKE ONE TABLET BY MOUTH EVERY MORNING 30 MINUTES BEFORE BREAKFAST  . pravastatin (PRAVACHOL) 40 MG tablet TAKE 1 TABLET(40 MG) BY MOUTH DAILY   No Known Allergies No results found for this or any previous visit (from the past 2160 hour(s)). Objective  Body mass index is 29.29 kg/m. Wt Readings  from Last 3 Encounters:  04/28/18 187 lb (84.8 kg)  11/10/17 190 lb 12 oz (86.5 kg)  09/23/17 185 lb (83.9 kg)   Temp Readings from Last 3 Encounters:  04/28/18 98.2 F (36.8 C) (Oral)  11/10/17 98.2 F (36.8 C) (Oral)  08/26/16 97.9 F (36.6 C) (Oral)   BP Readings from Last 3 Encounters:  04/28/18 130/72  11/10/17 122/84  08/26/16 126/66   Pulse Readings from Last 3 Encounters:  04/28/18 67  11/10/17 72  08/26/16 70    Physical Exam  Constitutional: She is oriented to person, place, and time. Vital signs are normal. She appears well-developed and well-nourished. She is cooperative.  HENT:  Head: Normocephalic and atraumatic.  Mouth/Throat: Oropharynx is clear and moist and mucous membranes are normal.  Eyes: Pupils are equal, round, and reactive to light. Conjunctivae are normal.  Cardiovascular: Normal rate, regular rhythm and normal heart sounds.  Pulmonary/Chest: Effort normal. She has wheezes.  Neurological: She is alert and oriented to person, place, and time. Gait normal.  Skin: Skin is warm, dry and  intact.  Psychiatric: She has a normal mood and affect. Her speech is normal and behavior is normal. Judgment and thought content normal. Cognition and memory are normal.  Nursing note and vitals reviewed.   Assessment   1.copd exacerbation  Plan   1. Depo 40, Duoneb x 1, Zpack, prednisone 40 mg qd x 5 days, prn albuterol  Mucinex/robitussin supportive care  rec smoking cessation   Provider: Dr. Olivia Mackie McLean-Scocuzza-Internal Medicine

## 2018-04-28 NOTE — Progress Notes (Signed)
Pre visit review using our clinic review tool, if applicable. No additional management support is needed unless otherwise documented below in the visit note. 

## 2018-07-08 ENCOUNTER — Other Ambulatory Visit: Payer: Self-pay | Admitting: Family

## 2018-08-29 ENCOUNTER — Ambulatory Visit: Payer: Medicare HMO | Admitting: Family

## 2018-09-05 ENCOUNTER — Ambulatory Visit (INDEPENDENT_AMBULATORY_CARE_PROVIDER_SITE_OTHER): Payer: Medicare HMO | Admitting: Family

## 2018-09-05 ENCOUNTER — Encounter: Payer: Self-pay | Admitting: Family

## 2018-09-05 DIAGNOSIS — E039 Hypothyroidism, unspecified: Secondary | ICD-10-CM | POA: Diagnosis not present

## 2018-09-05 DIAGNOSIS — I700 Atherosclerosis of aorta: Secondary | ICD-10-CM

## 2018-09-05 NOTE — Progress Notes (Signed)
I have called Dr. Edd Fabian office & they are faxing over pathology report.

## 2018-09-05 NOTE — Patient Instructions (Signed)
Nice to see you!   

## 2018-09-05 NOTE — Assessment & Plan Note (Signed)
Compliant with medication, jointly agreed we will repeat TSH when patient returns for CPE.

## 2018-09-05 NOTE — Assessment & Plan Note (Signed)
Compliant with medication. Will continue 

## 2018-09-05 NOTE — Progress Notes (Signed)
Subjective:    Patient ID: Tara Coleman, female    DOB: 1951/03/10, 68 y.o.   MRN: [id number]  CC: Tara Coleman is a 68 y.o. female who presents today for follow up.   HPI: Feels well today, no complaints.  Hypothyroidism-compliant medication, feels euthyroid.  No cold or heat intolerance  Has quit smoking since last visit.  Has gained weight since feels well, no further shortness of breath, cough, wheezing.  Resolved with Z-Pak, prednisone taper.  Never had to fill the albuterol.  HLD-taking Pravachol 2-3 times a week. No Cp.      States Colonoscopy 2017 Dr Tiffany Kocher- we will call to get records.   HISTORY:  Past Medical History:  Diagnosis Date  . Thyroid disease    Past Surgical History:  Procedure Laterality Date  . ADENOIDECTOMY     as a child  . CESAREAN SECTION     x2    Family History  Problem Relation Age of Onset  . Cancer Mother 103       biliary duct  . Thyroid disease Sister   . Psoriasis Son   . Colon cancer Maternal Grandfather        lived to 72  . Breast cancer Neg Hx     Allergies: Patient has no known allergies. Current Outpatient Medications on File Prior to Visit  Medication Sig Dispense Refill  . levothyroxine (SYNTHROID, LEVOTHROID) 100 MCG tablet TAKE ONE TABLET BY MOUTH EVERY MORNING 30 MINUTES BEFORE BREAKFAST 90 tablet 0  . pravastatin (PRAVACHOL) 40 MG tablet TAKE 1 TABLET(40 MG) BY MOUTH DAILY 90 tablet 1  . albuterol (PROVENTIL HFA;VENTOLIN HFA) 108 (90 Base) MCG/ACT inhaler Inhale 1-2 puffs into the lungs every 6 (six) hours as needed for wheezing or shortness of breath. (Patient not taking: Reported on 09/05/2018) 1 Inhaler 2   No current facility-administered medications on file prior to visit.     Social History   Tobacco Use  . Smoking status: Former Smoker    Packs/day: 1.00    Years: 30.00    Pack years: 30.00    Types: Cigarettes    Last attempt to quit: 04/17/2018    Years since quitting: 0.3  . Smokeless  tobacco: Never Used  Substance Use Topics  . Alcohol use: Yes    Alcohol/week: 0.0 standard drinks    Comment: Glass wine hs  . Drug use: No    Review of Systems  Constitutional: Negative for chills and fever.  Respiratory: Negative for cough, shortness of breath and wheezing.   Cardiovascular: Negative for chest pain and palpitations.  Gastrointestinal: Negative for nausea and vomiting.  Endocrine: Negative for cold intolerance and heat intolerance.      Objective:    BP 120/70   Pulse 77   Temp 97.9 F (36.6 C)   Wt 196 lb (88.9 kg)   SpO2 98%   BMI 30.70 kg/m  BP Readings from Last 3 Encounters:  09/05/18 120/70  04/28/18 130/72  11/10/17 122/84   Wt Readings from Last 3 Encounters:  09/05/18 196 lb (88.9 kg)  04/28/18 187 lb (84.8 kg)  11/10/17 190 lb 12 oz (86.5 kg)    Physical Exam Vitals signs reviewed.  Constitutional:      Appearance: She is well-developed.  Eyes:     Conjunctiva/sclera: Conjunctivae normal.  Cardiovascular:     Rate and Rhythm: Normal rate and regular rhythm.     Pulses: Normal pulses.     Heart sounds: Normal  heart sounds.  Pulmonary:     Effort: Pulmonary effort is normal.     Breath sounds: Normal breath sounds. No wheezing, rhonchi or rales.  Skin:    General: Skin is warm and dry.  Neurological:     Mental Status: She is alert.  Psychiatric:        Speech: Speech normal.        Behavior: Behavior normal.        Thought Content: Thought content normal.        Assessment & Plan:   Problem List Items Addressed This Visit      Cardiovascular and Mediastinum   Atherosclerosis of aorta ([place])    Compliant with medication. Will continue        Endocrine   Hypothyroidism    Compliant with medication, jointly agreed we will repeat TSH when patient returns for CPE.          I have discontinued Taylorann L. Badgett's azithromycin and predniSONE. I am also having her maintain her pravastatin, albuterol, and  levothyroxine.   No orders of the defined types were placed in this encounter.   Return precautions given.   Risks, benefits, and alternatives of the medications and treatment plan prescribed today were discussed, and patient expressed understanding.   Education regarding symptom management and diagnosis given to patient on AVS.  Continue to follow with Burnard Hawthorne, FNP for routine health maintenance.   Tara Coleman and I agreed with plan.   Tara Paris, FNP

## 2018-09-10 ENCOUNTER — Telehealth: Payer: Self-pay

## 2018-09-10 NOTE — Telephone Encounter (Signed)
Call pt regarding lung screening. Left message for pt to return call.  

## 2018-09-16 ENCOUNTER — Telehealth: Payer: Self-pay

## 2018-09-16 NOTE — Telephone Encounter (Signed)
Call pt regarding lung screening. Left message for pt to return call.  

## 2018-09-17 ENCOUNTER — Telehealth: Payer: Self-pay

## 2018-09-17 NOTE — Telephone Encounter (Signed)
2nd call  Call pt regarding lung screening. Left message for pt to return call.  

## 2018-09-23 ENCOUNTER — Telehealth: Payer: Self-pay

## 2018-09-23 NOTE — Telephone Encounter (Signed)
Call pt regarding lung screening. Left message for pt to return the call.

## 2018-10-03 ENCOUNTER — Other Ambulatory Visit: Payer: Self-pay | Admitting: Family

## 2018-10-03 DIAGNOSIS — E785 Hyperlipidemia, unspecified: Secondary | ICD-10-CM

## 2018-10-03 DIAGNOSIS — Z0000 Encounter for general adult medical examination without abnormal findings: Secondary | ICD-10-CM

## 2018-10-04 ENCOUNTER — Other Ambulatory Visit: Payer: Self-pay | Admitting: Family

## 2018-10-06 ENCOUNTER — Telehealth: Payer: Self-pay | Admitting: *Deleted

## 2018-10-06 ENCOUNTER — Encounter: Payer: Self-pay | Admitting: *Deleted

## 2018-10-06 NOTE — Telephone Encounter (Signed)
Attempted to contact patient r/t LDCT Screening follow up due at this time.  No answer received, message left for patient to call [phone] to schedule appointment.   Letter mailed to pt as we have been unable to reach them at this time.

## 2018-10-12 ENCOUNTER — Telehealth: Payer: Self-pay | Admitting: *Deleted

## 2018-10-12 ENCOUNTER — Encounter: Payer: Self-pay | Admitting: *Deleted

## 2018-10-12 DIAGNOSIS — Z122 Encounter for screening for malignant neoplasm of respiratory organs: Secondary | ICD-10-CM

## 2018-10-12 DIAGNOSIS — Z87891 Personal history of nicotine dependence: Secondary | ICD-10-CM

## 2018-10-12 NOTE — Telephone Encounter (Signed)
Patient has been notified that annual lung cancer screening low dose CT scan is due currently or will be in near future. Confirmed that patient is within the age range of 55-77, and asymptomatic, (no signs or symptoms of lung cancer). Patient denies illness that would prevent curative treatment for lung cancer if found. Verified smoking history, (former, quit 05/17/18, 30.75 pack year). The shared decision making visit was done 09/08/16. Patient is agreeable for CT scan being scheduled.  

## 2018-10-22 DIAGNOSIS — R05 Cough: Secondary | ICD-10-CM | POA: Diagnosis not present

## 2018-10-22 DIAGNOSIS — J101 Influenza due to other identified influenza virus with other respiratory manifestations: Secondary | ICD-10-CM | POA: Diagnosis not present

## 2018-11-03 ENCOUNTER — Encounter: Payer: Self-pay | Admitting: *Deleted

## 2018-11-16 ENCOUNTER — Ambulatory Visit: Payer: Medicare HMO

## 2018-12-26 ENCOUNTER — Other Ambulatory Visit: Payer: Self-pay | Admitting: Family

## 2019-01-04 DIAGNOSIS — R69 Illness, unspecified: Secondary | ICD-10-CM | POA: Diagnosis not present

## 2019-01-18 ENCOUNTER — Telehealth: Payer: Self-pay | Admitting: *Deleted

## 2019-01-18 DIAGNOSIS — Z87891 Personal history of nicotine dependence: Secondary | ICD-10-CM

## 2019-01-18 DIAGNOSIS — Z122 Encounter for screening for malignant neoplasm of respiratory organs: Secondary | ICD-10-CM

## 2019-01-18 NOTE — Telephone Encounter (Signed)
Patient has been notified that annual lung cancer screening low dose CT scan is due currently or will be in near future. Confirmed that patient is within the age range of 55-77, and asymptomatic, (no signs or symptoms of lung cancer). Patient denies illness that would prevent curative treatment for lung cancer if found. Verified smoking history, (former, quit 05/17/18, 30.75 pack year). The shared decision making visit was done 09/08/16. Patient is agreeable for CT scan being scheduled.  

## 2019-01-18 NOTE — Telephone Encounter (Signed)
Left message for patient to notify them that it is time to schedule annual low dose lung cancer screening CT scan. Instructed patient to call back to verify information prior to the scan being scheduled.  

## 2019-01-26 ENCOUNTER — Other Ambulatory Visit: Payer: Self-pay

## 2019-01-26 ENCOUNTER — Ambulatory Visit
Admission: RE | Admit: 2019-01-26 | Discharge: 2019-01-26 | Disposition: A | Discharge status: Home / Self Care | Payer: Medicare HMO | Source: Ambulatory Visit | Attending: Nurse Practitioner | Admitting: Nurse Practitioner

## 2019-01-26 DIAGNOSIS — Z122 Encounter for screening for malignant neoplasm of respiratory organs: Secondary | ICD-10-CM

## 2019-01-26 DIAGNOSIS — Z87891 Personal history of nicotine dependence: Secondary | ICD-10-CM | POA: Diagnosis not present

## 2019-01-27 ENCOUNTER — Encounter: Payer: Self-pay | Admitting: *Deleted

## 2019-01-30 ENCOUNTER — Telehealth: Payer: Self-pay | Admitting: Family

## 2019-01-30 NOTE — Telephone Encounter (Signed)
Mail letter   [facility] you are doing well.   I received results from your annual CT lung scan from [facility] which showed benign findings of lungs. You will need do this again next year so please ensure you are contacted in regards to this and certainly call our office if you are not contacted for another test.   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   This can put you at risk for stroke, heart attack.   Based on these persistent findings, I advise to further evaluate degree of cholesterol build up in the heart with cardiology.   Please call the office if you would like to discuss this with me or would like pursue further evaluation of cholesterol with cardiology referral.    Best,   Mable Paris, NP

## 2019-01-30 NOTE — Telephone Encounter (Signed)
Printed and mailed

## 2019-02-02 DIAGNOSIS — H52203 Unspecified astigmatism, bilateral: Secondary | ICD-10-CM | POA: Diagnosis not present

## 2019-02-24 ENCOUNTER — Other Ambulatory Visit: Payer: Self-pay | Admitting: Family

## 2019-02-24 DIAGNOSIS — Z1231 Encounter for screening mammogram for malignant neoplasm of breast: Secondary | ICD-10-CM

## 2019-03-27 ENCOUNTER — Other Ambulatory Visit: Payer: Self-pay | Admitting: Family

## 2019-03-31 ENCOUNTER — Other Ambulatory Visit: Payer: Self-pay

## 2019-03-31 ENCOUNTER — Ambulatory Visit (INDEPENDENT_AMBULATORY_CARE_PROVIDER_SITE_OTHER): Payer: Medicare HMO

## 2019-03-31 DIAGNOSIS — Z0000 Encounter for general adult medical examination without abnormal findings: Secondary | ICD-10-CM | POA: Diagnosis not present

## 2019-03-31 NOTE — Patient Instructions (Addendum)
  Tara Coleman , Thank you for taking time to come for your Medicare Wellness Visit. I appreciate your ongoing commitment to your health goals. Please review the following plan we discussed and let me know if I can assist you in the future.   These are the goals we discussed: Goals      Patient Stated   . Weight (lb) < 195 lb (88.5 kg) (pt-stated)       This is a list of the screening recommended for you and due dates:  Health Maintenance  Topic Date Due  . Flu Shot  03/18/2019  . Mammogram  01/05/2020  . Colon Cancer Screening  09/02/2020  . Pneumonia vaccines (2 of 2 - PPSV23) 09/10/2021  . Tetanus Vaccine  08/27/2026  . DEXA scan (bone density measurement)  Completed  .  Hepatitis C: One time screening is recommended by Center for Disease Control  (CDC) for  adults born from [birth year] through 1965.   Completed

## 2019-03-31 NOTE — Progress Notes (Signed)
Subjective:   Tara Coleman is a 68 y.o. female who presents for an Initial Medicare Annual Wellness Visit.  Review of Systems    No ROS.  Medicare Wellness Virtual Visit.  Visual/audio telehealth visit, UTA vital signs.   See social history for additional risk factors.  Cardiac Risk Factors include: advanced age (>41men, >50 women)     Objective:    Today's Vitals   There is no height or weight on file to calculate BMI.  Advanced Directives 03/31/2019  Does Patient Have a Medical Advance Directive? Yes  Type of Advance Directive [place]  Does patient want to make changes to medical advance directive? No - Patient declined  Copy of [place] in Chart? No - copy requested    Current Medications (verified) Outpatient Encounter Medications as of 03/31/2019  Medication Sig  . albuterol (PROVENTIL HFA;VENTOLIN HFA) 108 (90 Base) MCG/ACT inhaler Inhale 1-2 puffs into the lungs every 6 (six) hours as needed for wheezing or shortness of breath. (Patient not taking: Reported on 09/05/2018)  . levothyroxine (SYNTHROID) 100 MCG tablet TAKE ONE TABLET BY MOUTH EVERY MORNING 30 MINUTES BEFORE BREAKFAST  . pravastatin (PRAVACHOL) 40 MG tablet TAKE ONE TABLET BY MOUTH DAILY   No facility-administered encounter medications on file as of 03/31/2019.     Allergies (verified) Patient has no known allergies.   History: Past Medical History:  Diagnosis Date  . Thyroid disease    Past Surgical History:  Procedure Laterality Date  . ADENOIDECTOMY     as a child  . CESAREAN SECTION     x2    Family History  Problem Relation Age of Onset  . Cancer Mother 69       biliary duct  . Thyroid disease Sister   . Psoriasis Son   . Colon cancer Maternal Grandfather        lived to 69  . Breast cancer Neg Hx    Social History   Socioeconomic History  . Marital status: Married    Spouse name: Not on file  . Number of children: 2  . Years of  education: Not on file  . Highest education level: Not on file  Occupational History  . 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 Financial resource strain: Not hard at all  . Food insecurity    Worry: Never true    Inability: Never true  . Transportation needs    Medical: No    Non-medical: No  Tobacco Use  . Smoking status: Former Smoker    Packs/day: 1.00    Years: 30.00    Pack years: 30.00    Types: Cigarettes    Quit date: 04/17/2018    Years since quitting: 0.9  . Smokeless tobacco: Never Used  Substance and Sexual Activity  . Alcohol use: Yes    Alcohol/week: 0.0 standard drinks    Comment: Glass wine hs  . Drug use: No  . Sexual activity: Not on file  Lifestyle  . Physical activity    Days per week: 7 days    Minutes per session: 120 min  . Stress: Not at all  Relationships  . Social Herbalist on phone: Not on file    Gets together: Not on file    Attends religious service: Not on file    Active member of club or organization: Not on file    Attends meetings of clubs or organizations: Not  on file    Relationship status: Not on file  Other Topics Concern  . 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 N  Vision? N  Difficulty concentrating or making decisions? N  Walking or climbing stairs? N  Dressing or bathing? N  Doing errands, shopping? N  Preparing Food and eating ? N  Using the Toilet? N  In the past six months, have you accidently leaked urine? N  Do  you have problems with loss of bowel control? N  Managing your Medications? N  Managing your Finances? N  Housekeeping or managing your Housekeeping? N  Some recent data might be hidden     Immunizations and Health Maintenance Immunization History  Administered Date(s) Administered  . Pneumococcal Conjugate-13 11/10/2017  . Pneumococcal Polysaccharide-23 09/10/2016  . Tdap 08/05/2005, 08/27/2016  . Zoster 07/28/2013   Health Maintenance Due  Topic Date Due  . INFLUENZA VACCINE  03/18/2019    Patient Care Team: Burnard Hawthorne, FNP as PCP - General (Family Medicine)  Indicate any recent Medical Services you may have received from other than [facility] providers in the past year (date may be approximate).     Assessment:   This is a routine wellness examination for Tara Coleman.  I connected with patient 03/31/19 at  9:00 AM EDT by an audio enabled telemedicine application and verified that I am speaking with the correct person using two identifiers. Patient stated full name and DOB. Patient gave permission to continue with virtual visit. Patient's location was at home and Nurse's location was at [place] office.   Health Maintenance Due: Influenza vaccine 2020- discussed; to be completed in season with doctor or local pharmacy. Update as appropriate.  Mammogram- scheduled 05/02/19 See completed HM at the end of note.  Eye- Visual acuity not assessed. Virtual visit. Wears corrective lenses. Followed by their ophthalmologist every 12 months.   Dental- every 6 months.    Hearing-demonstrates normal hearing during visit.  Safety  Patient feels safe at home- yes Patient does have smoke detectors at home- yes Patient does wear sunscreen or protective clothing when in direct sunlight - yes Patient does wear seat belt when in a moving vehicle - yes Patient drives- yes Adequate lighting in walkways free from debris- yes Grab bars and handrails used as appropriate- yes Ambulates  with no assistive device  Social  Alcohol intake - no    yes      Smoking history- former    Smokers in home? none Illicit drug use? None  Covid-19 precautions and sickness symptoms discussed. Wears mask as appropriate.   Activities of Daily Living Patient denies needing assistance with: household chores, feeding themselves, getting from bed to chair, getting to the toilet, bathing/showering, dressing, managing money, or preparing meals.  No failures at ADL's or IADL's.   Memory: Patient is alert. Patient denies difficulty focusing or concentrating. Correctly identified the president of the [place], season and recall 5/5. Patient likes to  read, play computer games complete puzzles for brain stimulation.  BMI- discussed the importance of a healthy diet, water intake and the benefits of aerobic exercise.  Educational material provided.  Physical activity- Walking 10,000 steps daily. Wears fitness tracker.   Diet: Weight watchers Water: good intake  Advanced Directive End of life planning; Advance aging; Advanced directives discussed.  Copy of current HCPOA/Living Will requested.    Other Providers Patient Care Team: Burnard Hawthorne, FNP as PCP - General (Family Medicine)   Dermatologist- annual follow up  Hearing/Vision screen  Hearing Screening   125Hz  250Hz  500Hz  1000Hz  2000Hz  3000Hz  4000Hz  6000Hz  8000Hz   Right ear:           Left ear:           Comments: Patient is able to hear conversational tones without difficulty.  No issues reported.   Vision Screening Comments: Wears corrective lenses Visual acuity not assessed, virtual visit.  They have seen their ophthalmologist in the last 12 months.   Dietary issues and exercise activities discussed: Current Exercise Habits: Home exercise routine, Type of exercise: walking  Goals      Patient Stated   . Weight (lb) < 195 lb (88.5 kg) (pt-stated)     Patient has lost 29lb by actively walking and weight watchers diet  since May 2020. She would like to lose 15 more pounds.   Depression Screen PHQ 2/9 Scores 03/31/2019 09/05/2018 09/05/2018 04/28/2018 08/26/2016 08/05/2012  PHQ - 2 Score 0 0 0 0 0 0  PHQ- 9 Score - 0 1 - - -  Depression- PHQ 2 &9 complete.  There are no signs/symptoms or verbal communication regarding depression, irritability, anhedonia, sadness/tearfullness.    Fall Risk Fall Risk  03/31/2019 04/28/2018 08/26/2016  Falls in the past year? 0 No No   Cognitive Function:     6CIT Screen 03/31/2019  What Year? 0 points  What month? 0 points  What time? 0 points  Count back from 20 0 points  Months in reverse 0 points  Repeat phrase 0 points  Total Score 0    Screening Tests Health Maintenance  Topic Date Due  . INFLUENZA VACCINE  03/18/2019  . MAMMOGRAM  01/05/2020  . COLONOSCOPY  09/02/2020  . PNA vac Low Risk Adult (2 of 2 - PPSV23) 09/10/2021  . TETANUS/TDAP  08/27/2026  . DEXA SCAN  Completed  . Hepatitis C Screening  Completed  Immunizations The following Immunizations were discussed: Influenza, shingles, pneumonia, and tetanus.     Plan:   Keep all routine maintenance appointments.   I have personally reviewed and noted the following in the patient's chart:   . Medical and social history . Use of alcohol, tobacco or illicit drugs  . Current medications and supplements . Functional ability and status . Nutritional status . Physical activity . Advanced directives . List of other physicians . Hospitalizations, surgeries, and ER visits in previous 12 months . Vitals . Screenings to include cognitive, depression, and falls . Referrals and appointments  In addition, I have reviewed and discussed with patient certain preventive protocols, quality metrics, and best practice recommendations. A written personalized care plan for preventive services as well as general preventive health recommendations were provided to patient.     Varney Biles, LPN   4/40/1027     Agree with plan. Mable Paris, NP

## 2019-05-02 ENCOUNTER — Ambulatory Visit
Admission: RE | Admit: 2019-05-02 | Discharge: 2019-05-02 | Disposition: A | Discharge status: Home / Self Care | Payer: Medicare HMO | Source: Ambulatory Visit | Attending: Family | Admitting: Family

## 2019-05-02 DIAGNOSIS — Z1231 Encounter for screening mammogram for malignant neoplasm of breast: Secondary | ICD-10-CM

## 2019-05-08 DIAGNOSIS — Z86018 Personal history of other benign neoplasm: Secondary | ICD-10-CM | POA: Diagnosis not present

## 2019-05-08 DIAGNOSIS — L821 Other seborrheic keratosis: Secondary | ICD-10-CM | POA: Diagnosis not present

## 2019-05-08 DIAGNOSIS — D1800 Hemangioma unspecified site: Secondary | ICD-10-CM | POA: Diagnosis not present

## 2019-05-08 DIAGNOSIS — D224 Melanocytic nevi of scalp and neck: Secondary | ICD-10-CM | POA: Diagnosis not present

## 2019-05-08 DIAGNOSIS — D229 Melanocytic nevi, unspecified: Secondary | ICD-10-CM | POA: Diagnosis not present

## 2019-05-08 DIAGNOSIS — L719 Rosacea, unspecified: Secondary | ICD-10-CM | POA: Diagnosis not present

## 2019-05-08 DIAGNOSIS — D225 Melanocytic nevi of trunk: Secondary | ICD-10-CM | POA: Diagnosis not present

## 2019-05-08 DIAGNOSIS — L814 Other melanin hyperpigmentation: Secondary | ICD-10-CM | POA: Diagnosis not present

## 2019-05-08 DIAGNOSIS — Z1283 Encounter for screening for malignant neoplasm of skin: Secondary | ICD-10-CM | POA: Diagnosis not present

## 2019-05-08 DIAGNOSIS — R69 Illness, unspecified: Secondary | ICD-10-CM | POA: Diagnosis not present

## 2019-05-19 ENCOUNTER — Ambulatory Visit: Payer: Medicare HMO

## 2019-06-26 ENCOUNTER — Other Ambulatory Visit: Payer: Self-pay | Admitting: Family

## 2019-07-18 DIAGNOSIS — R69 Illness, unspecified: Secondary | ICD-10-CM | POA: Diagnosis not present

## 2019-09-21 ENCOUNTER — Other Ambulatory Visit: Payer: Self-pay | Admitting: Family

## 2019-10-24 ENCOUNTER — Encounter: Payer: Self-pay | Admitting: Family

## 2019-12-07 DIAGNOSIS — Z03818 Encounter for observation for suspected exposure to other biological agents ruled out: Secondary | ICD-10-CM | POA: Diagnosis not present

## 2019-12-07 DIAGNOSIS — Z20828 Contact with and (suspected) exposure to other viral communicable diseases: Secondary | ICD-10-CM | POA: Diagnosis not present

## 2019-12-22 ENCOUNTER — Other Ambulatory Visit: Payer: Self-pay | Admitting: Family

## 2020-01-02 ENCOUNTER — Encounter: Payer: Self-pay | Admitting: Family

## 2020-01-02 ENCOUNTER — Ambulatory Visit (INDEPENDENT_AMBULATORY_CARE_PROVIDER_SITE_OTHER): Payer: Medicare HMO | Admitting: Family

## 2020-01-02 ENCOUNTER — Other Ambulatory Visit: Payer: Self-pay

## 2020-01-02 VITALS — BP 108/60 | HR 75 | Temp 96.9°F | Ht 67.0 in | Wt 181.0 lb

## 2020-01-02 DIAGNOSIS — I700 Atherosclerosis of aorta: Secondary | ICD-10-CM | POA: Diagnosis not present

## 2020-01-02 DIAGNOSIS — E039 Hypothyroidism, unspecified: Secondary | ICD-10-CM

## 2020-01-02 NOTE — Patient Instructions (Addendum)
Always a pleasure seeing you!  When you do your mammogram in the Fall of this year, please call and ask for an order and also asked for a bone density order as well.  We can do this at the same time at [facility].   Please ensure that you schedule your CT Chest for lung cancer screening. [facility] - [phone]

## 2020-01-02 NOTE — Assessment & Plan Note (Signed)
Had a great discussion with patient in regards to the importance of staying on cholesterol medication with known atherosclerosis.  She will continue with the lung cancer screening which is due next month.

## 2020-01-02 NOTE — Progress Notes (Signed)
Subjective:    Patient ID: Tara Coleman, female    DOB: 12/17/1950, 69 y.o.   MRN: [id number]  CC: Tara Coleman is a 69 y.o. female who presents today for follow up.   HPI: Feels well today.  No complaints  Walks at least 10,000 steps a day. No chest pain, fatigue.  No depression. Hypothyroidism-compliant with Synthroid dose. HLD - taking pravastatin 3-4 times per week.  If she increases the frequency, she will experience arthralgias. Up-to-date mammogram, colonoscopy, CT lung cancer screening program. Denies any vaginal complaints.  Declines further Pap smear at this time  Would like screening labs done.  dexa due fall 2021 HISTORY:  Past Medical History:  Diagnosis Date  . Thyroid disease    Past Surgical History:  Procedure Laterality Date  . ADENOIDECTOMY     as a child  . CESAREAN SECTION     x2    Family History  Problem Relation Age of Onset  . Cancer Mother 59       biliary duct  . Thyroid disease Sister   . Psoriasis Son   . Colon cancer Maternal Grandfather        lived to 72  . Breast cancer Neg Hx     Allergies: Patient has no known allergies. Current Outpatient Medications on File Prior to Visit  Medication Sig Dispense Refill  . levothyroxine (SYNTHROID) 100 MCG tablet TAKE ONE TABLET BY MOUTH EVERY MORNING 30 MINUTES BEFORE BREAKFAST 90 tablet 0  . pravastatin (PRAVACHOL) 40 MG tablet TAKE ONE TABLET BY MOUTH DAILY (Patient taking differently: Take 40 mg by mouth 3 (three) times a week. TAKE ONE TABLET BY MOUTH DAILY) 90 tablet 1   No current facility-administered medications on file prior to visit.    Social History   Tobacco Use  . Smoking status: Former Smoker    Packs/day: 1.00    Years: 30.00    Pack years: 30.00    Types: Cigarettes    Quit date: 04/17/2018    Years since quitting: 1.7  . Smokeless tobacco: Never Used  Substance Use Topics  . Alcohol use: Yes    Alcohol/week: 0.0 standard drinks    Comment: Glass  wine hs  . Drug use: No    Review of Systems  Constitutional: Negative for chills and fever.  Respiratory: Negative for cough.   Cardiovascular: Negative for chest pain and palpitations.  Gastrointestinal: Negative for nausea and vomiting.  Genitourinary: Negative for pelvic pain.      Objective:    BP 108/60   Pulse 75   Temp (!) 96.9 F (36.1 C) (Temporal)   Ht 5\' 7"  (1.702 m)   Wt 181 lb (82.1 kg)   SpO2 97%   BMI 28.35 kg/m  BP Readings from Last 3 Encounters:  01/02/20 108/60  09/05/18 120/70  04/28/18 130/72   Wt Readings from Last 3 Encounters:  01/02/20 181 lb (82.1 kg)  01/26/19 187 lb (84.8 kg)  09/05/18 196 lb (88.9 kg)    Physical Exam Vitals reviewed.  Constitutional:      Appearance: She is well-developed.  Eyes:     Conjunctiva/sclera: Conjunctivae normal.  Cardiovascular:     Rate and Rhythm: Normal rate and regular rhythm.     Pulses: Normal pulses.     Heart sounds: Normal heart sounds.  Pulmonary:     Effort: Pulmonary effort is normal.     Breath sounds: Normal breath sounds. No wheezing, rhonchi or rales.  Skin:  General: Skin is warm and dry.  Neurological:     Mental Status: She is alert.  Psychiatric:        Speech: Speech normal.        Behavior: Behavior normal.        Thought Content: Thought content normal.        Assessment & Plan:   Problem List Items Addressed This Visit      Cardiovascular and Mediastinum   Atherosclerosis of aorta ([place]) - Primary    Had a great discussion with patient in regards to the importance of staying on cholesterol medication with known atherosclerosis.  She will continue with the lung cancer screening which is due next month.      Relevant Orders   Comprehensive metabolic panel   Hemoglobin A1c   Lipid panel   VITAMIN D 25 Hydroxy (Vit-D Deficiency, Fractures)     Endocrine   Hypothyroidism    Pending tsh.       Relevant Orders   CBC with Differential/Platelet   TSH        I have discontinued Tara Coleman's albuterol. I am also having her maintain her pravastatin and levothyroxine.   No orders of the defined types were placed in this encounter.   Return precautions given.   Risks, benefits, and alternatives of the medications and treatment plan prescribed today were discussed, and patient expressed understanding.   Education regarding symptom management and diagnosis given to patient on AVS.  Continue to follow with Tara Hawthorne, FNP for routine health maintenance.   Tara Coleman and I agreed with plan.   Tara Paris, FNP

## 2020-01-02 NOTE — Assessment & Plan Note (Signed)
Pending tsh 

## 2020-01-25 ENCOUNTER — Telehealth: Payer: Self-pay

## 2020-01-25 NOTE — Telephone Encounter (Signed)
Message left notifying patient that it is time to schedule the low dose lung cancer screening CT scan.  Instructed patient to return call to Shawn Perkins at [phone] to verify information prior to CT scan being scheduled.    

## 2020-02-09 ENCOUNTER — Telehealth: Payer: Self-pay | Admitting: *Deleted

## 2020-02-09 NOTE — Telephone Encounter (Signed)
(  02/09/2020) Left message for pt to notify them that it is time to schedule annual low dose lung cancer screening CT scan. Instructed patient to call back to verify information prior to the scan being scheduled SRW

## 2020-02-15 DIAGNOSIS — R69 Illness, unspecified: Secondary | ICD-10-CM | POA: Diagnosis not present

## 2020-02-25 ENCOUNTER — Telehealth: Payer: Self-pay

## 2020-02-25 NOTE — Telephone Encounter (Signed)
Contacted patient to schedule her annual lung CT screening scan. (her last scan was June 2020).  Message left with patient asking her to call Burgess Estelle, lung navigator to schedule scan.

## 2020-03-04 ENCOUNTER — Other Ambulatory Visit (INDEPENDENT_AMBULATORY_CARE_PROVIDER_SITE_OTHER): Payer: Medicare HMO

## 2020-03-04 ENCOUNTER — Other Ambulatory Visit: Payer: Self-pay

## 2020-03-04 ENCOUNTER — Telehealth: Payer: Self-pay | Admitting: *Deleted

## 2020-03-04 DIAGNOSIS — Z122 Encounter for screening for malignant neoplasm of respiratory organs: Secondary | ICD-10-CM

## 2020-03-04 DIAGNOSIS — I700 Atherosclerosis of aorta: Secondary | ICD-10-CM

## 2020-03-04 DIAGNOSIS — Z87891 Personal history of nicotine dependence: Secondary | ICD-10-CM

## 2020-03-04 DIAGNOSIS — E039 Hypothyroidism, unspecified: Secondary | ICD-10-CM

## 2020-03-04 LAB — CBC WITH DIFFERENTIAL/PLATELET
Basophils Absolute: 0.1 10*3/uL (ref 0.0–0.1)
Basophils Relative: 1.2 % (ref 0.0–3.0)
Eosinophils Absolute: 0.1 10*3/uL (ref 0.0–0.7)
Eosinophils Relative: 2.3 % (ref 0.0–5.0)
HCT: 42.2 % (ref 36.0–46.0)
Hemoglobin: 14.1 g/dL (ref 12.0–15.0)
Lymphocytes Relative: 28.9 % (ref 12.0–46.0)
Lymphs Abs: 1.5 10*3/uL (ref 0.7–4.0)
MCHC: 33.4 g/dL (ref 30.0–36.0)
MCV: 88.8 fl (ref 78.0–100.0)
Monocytes Absolute: 0.5 10*3/uL (ref 0.1–1.0)
Monocytes Relative: 9.9 % (ref 3.0–12.0)
Neutro Abs: 3.1 10*3/uL (ref 1.4–7.7)
Neutrophils Relative %: 57.7 % (ref 43.0–77.0)
Platelets: 282 10*3/uL (ref 150.0–400.0)
RBC: 4.75 Mil/uL (ref 3.87–5.11)
RDW: 13.6 % (ref 11.5–15.5)
WBC: 5.3 10*3/uL (ref 4.0–10.5)

## 2020-03-04 LAB — TSH: TSH: 1.29 u[IU]/mL (ref 0.35–4.50)

## 2020-03-04 LAB — COMPREHENSIVE METABOLIC PANEL
ALT: 11 U/L (ref 0–35)
AST: 14 U/L (ref 0–37)
Albumin: 4.3 g/dL (ref 3.5–5.2)
Alkaline Phosphatase: 78 U/L (ref 39–117)
BUN: 15 mg/dL (ref 6–23)
CO2: 30 mEq/L (ref 19–32)
Calcium: 9.3 mg/dL (ref 8.4–10.5)
Chloride: 102 mEq/L (ref 96–112)
Creatinine, Ser: 0.8 mg/dL (ref 0.40–1.20)
GFR: 71.43 mL/min (ref >60.00)
Glucose, Bld: 96 mg/dL (ref 70–99)
Potassium: 4.3 mEq/L (ref 3.5–5.1)
Sodium: 142 mEq/L (ref 135–145)
Total Bilirubin: 1.4 mg/dL — ABNORMAL HIGH (ref 0.2–1.2)
Total Protein: 6.5 g/dL (ref 6.0–8.3)

## 2020-03-04 LAB — LIPID PANEL
Cholesterol: 179 mg/dL (ref 0–200)
HDL: 74.3 mg/dL (ref >39.00)
LDL Cholesterol: 88 mg/dL (ref 0–99)
NonHDL: 104.37
Total CHOL/HDL Ratio: 2
Triglycerides: 80 mg/dL (ref 0.0–149.0)
VLDL: 16 mg/dL (ref 0.0–40.0)

## 2020-03-04 LAB — VITAMIN D 25 HYDROXY (VIT D DEFICIENCY, FRACTURES): VITD: 22.24 ng/mL — ABNORMAL LOW (ref 30.00–100.00)

## 2020-03-04 LAB — HEMOGLOBIN A1C: Hgb A1c MFr Bld: 5.5 % (ref 4.6–6.5)

## 2020-03-04 NOTE — Telephone Encounter (Signed)
Patient has been notified that annual lung cancer screening low dose CT scan is due currently or will be in near future. Confirmed that patient is within the age range of 55-77, and asymptomatic, (no signs or symptoms of lung cancer). Patient denies illness that would prevent curative treatment for lung cancer if found. Verified smoking history, (former, quit 05/17/18, 30.75 pack year). The shared decision making visit was done 09/08/16. Patient is agreeable for CT scan being scheduled.

## 2020-03-05 ENCOUNTER — Other Ambulatory Visit: Payer: Self-pay | Admitting: Family

## 2020-03-05 ENCOUNTER — Other Ambulatory Visit (INDEPENDENT_AMBULATORY_CARE_PROVIDER_SITE_OTHER): Payer: Medicare HMO

## 2020-03-05 DIAGNOSIS — R17 Unspecified jaundice: Secondary | ICD-10-CM

## 2020-03-05 LAB — HEPATIC FUNCTION PANEL
ALT: 10 U/L (ref 0–35)
AST: 14 U/L (ref 0–37)
Albumin: 4.3 g/dL (ref 3.5–5.2)
Alkaline Phosphatase: 78 U/L (ref 39–117)
Bilirubin, Direct: 0.2 mg/dL (ref 0.0–0.3)
Total Bilirubin: 1.1 mg/dL (ref 0.2–1.2)
Total Protein: 6.6 g/dL (ref 6.0–8.3)

## 2020-03-06 ENCOUNTER — Encounter: Payer: Self-pay | Admitting: Family

## 2020-03-18 ENCOUNTER — Other Ambulatory Visit: Payer: Self-pay | Admitting: Family

## 2020-04-02 ENCOUNTER — Ambulatory Visit: Payer: Medicare HMO

## 2020-04-04 ENCOUNTER — Ambulatory Visit
Admission: RE | Admit: 2020-04-04 | Discharge: 2020-04-04 | Disposition: A | Discharge status: Home / Self Care | Payer: Medicare HMO | Source: Ambulatory Visit | Attending: Oncology | Admitting: Oncology

## 2020-04-04 ENCOUNTER — Other Ambulatory Visit: Payer: Self-pay

## 2020-04-04 DIAGNOSIS — Z122 Encounter for screening for malignant neoplasm of respiratory organs: Secondary | ICD-10-CM | POA: Insufficient documentation

## 2020-04-04 DIAGNOSIS — Z87891 Personal history of nicotine dependence: Secondary | ICD-10-CM | POA: Diagnosis not present

## 2020-04-09 ENCOUNTER — Encounter: Payer: Self-pay | Admitting: *Deleted

## 2020-04-16 ENCOUNTER — Telehealth: Payer: Self-pay | Admitting: Family

## 2020-04-16 DIAGNOSIS — K769 Liver disease, unspecified: Secondary | ICD-10-CM

## 2020-04-16 NOTE — Telephone Encounter (Signed)
Patient stated that she was okay with ordering image needed for liver as well [place]. [place] the [place] two [place] & fears she won't get the call, but I told her that non-urgent referrals sometimes take some time as well.

## 2020-04-16 NOTE — Telephone Encounter (Signed)
Call pt   I received results from your annual CT lung scan from [facility] which showed benign findings of lungs. You will need do this again next year so please ensure you are contacted in regards to this and certainly call our office if you are not contacted for another test.   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 Continue pravastatin.  CT also showed incidental findings as it relates to gallbladder with possible stone and very small liver lesion.  I would advise dedicated liver image with ultrasound.  Can I order?  CT Chest  Upper abdomen: Cholelithiasis. Subcentimeter hypodense anterior left liver lesion is too small to characterize and is unchanged, considered benign.

## 2020-04-17 ENCOUNTER — Telehealth: Payer: Self-pay | Admitting: Family

## 2020-04-17 NOTE — Telephone Encounter (Signed)
Ordered

## 2020-04-17 NOTE — Telephone Encounter (Signed)
lft vm for pt to call ofc to sch [place].   Please give pt number to call and sch at [phone] option 3 and then 2 to sch.  Thanks

## 2020-04-18 ENCOUNTER — Telehealth: Payer: Self-pay | Admitting: Family

## 2020-04-18 NOTE — Telephone Encounter (Signed)
lft vm for pt to call ofc to sch [place] 

## 2020-05-02 ENCOUNTER — Other Ambulatory Visit: Payer: Self-pay

## 2020-05-02 ENCOUNTER — Ambulatory Visit
Admission: RE | Admit: 2020-05-02 | Discharge: 2020-05-02 | Disposition: A | Discharge status: Home / Self Care | Payer: Medicare HMO | Source: Ambulatory Visit | Attending: Family | Admitting: Family

## 2020-05-02 DIAGNOSIS — K769 Liver disease, unspecified: Secondary | ICD-10-CM | POA: Diagnosis not present

## 2020-05-02 DIAGNOSIS — K8020 Calculus of gallbladder without cholecystitis without obstruction: Secondary | ICD-10-CM | POA: Diagnosis not present

## 2020-05-03 ENCOUNTER — Encounter: Payer: Self-pay | Admitting: Family

## 2020-05-03 DIAGNOSIS — K7689 Other specified diseases of liver: Secondary | ICD-10-CM | POA: Insufficient documentation

## 2020-05-13 ENCOUNTER — Encounter: Payer: Medicare HMO | Admitting: Dermatology

## 2020-06-17 ENCOUNTER — Other Ambulatory Visit: Payer: Self-pay | Admitting: Family

## 2020-06-19 ENCOUNTER — Other Ambulatory Visit: Payer: Self-pay | Admitting: Family

## 2020-06-19 ENCOUNTER — Encounter: Payer: Self-pay | Admitting: Family

## 2020-06-19 DIAGNOSIS — Z1231 Encounter for screening mammogram for malignant neoplasm of breast: Secondary | ICD-10-CM

## 2020-06-19 DIAGNOSIS — R69 Illness, unspecified: Secondary | ICD-10-CM | POA: Diagnosis not present

## 2020-06-27 ENCOUNTER — Telehealth: Payer: Self-pay | Admitting: Family

## 2020-06-27 NOTE — Telephone Encounter (Signed)
Left message for patient to call back and schedule Medicare Annual Wellness Visit (AWV)   This should be a telephone visit only=30 minutes.  Last AWV 03/31/19; please schedule at anytime with Denisa O'Brien-Blaney at [facility].

## 2020-06-28 NOTE — Telephone Encounter (Signed)
Patient returned Allison's phone call to schedule a AWV. Patient states she would prefer not to have one.

## 2020-07-27 DIAGNOSIS — Z20822 Contact with and (suspected) exposure to covid-19: Secondary | ICD-10-CM | POA: Diagnosis not present

## 2020-07-27 DIAGNOSIS — Z03818 Encounter for observation for suspected exposure to other biological agents ruled out: Secondary | ICD-10-CM | POA: Diagnosis not present

## 2020-08-05 ENCOUNTER — Ambulatory Visit: Payer: Medicare HMO | Admitting: Dermatology

## 2020-08-23 ENCOUNTER — Other Ambulatory Visit: Payer: Self-pay

## 2020-08-23 ENCOUNTER — Ambulatory Visit
Admission: RE | Admit: 2020-08-23 | Discharge: 2020-08-23 | Disposition: A | Discharge status: Home / Self Care | Payer: Medicare HMO | Source: Ambulatory Visit | Attending: Family | Admitting: Family

## 2020-08-23 DIAGNOSIS — Z1231 Encounter for screening mammogram for malignant neoplasm of breast: Secondary | ICD-10-CM | POA: Insufficient documentation

## 2020-11-13 ENCOUNTER — Ambulatory Visit: Payer: Medicare HMO | Admitting: Dermatology

## 2020-12-10 ENCOUNTER — Other Ambulatory Visit: Payer: Self-pay | Admitting: Family

## 2020-12-18 ENCOUNTER — Encounter: Payer: Self-pay | Admitting: Family

## 2020-12-18 IMAGING — CT CT CHEST LUNG CANCER SCREENING LOW DOSE W/O CM
2 of 5 series · 15 of 40 positions shown, 18 images · non-contrast
Comparison: 01/26/2019 screening chest CT.

CLINICAL DATA: 69-year-old asymptomatic female former smoker with
30.75 pack-year smoking history, quit smoking 2 years prior.

EXAM:
CT CHEST WITHOUT CONTRAST LOW-DOSE FOR LUNG CANCER SCREENING
TECHNIQUE: Multidetector CT imaging of the chest was performed following the
standard protocol without IV contrast.

[Series 3: lung 1.00 · axial · 0.64mm/px · z∈[+1747,+2042]mm · 12 of 325 slices shown, 15 images]
[im 15/325  mediastinal]
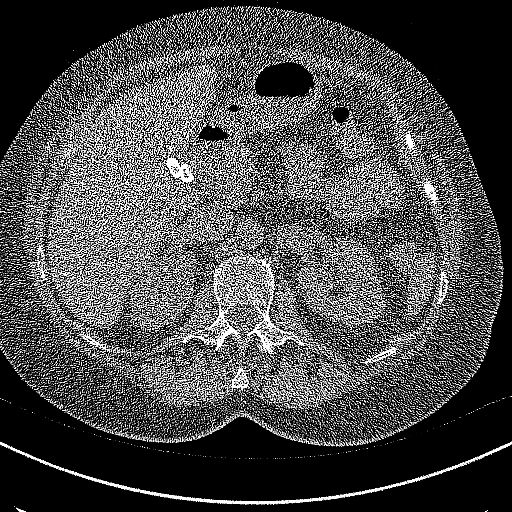
[im 15/325  lung]
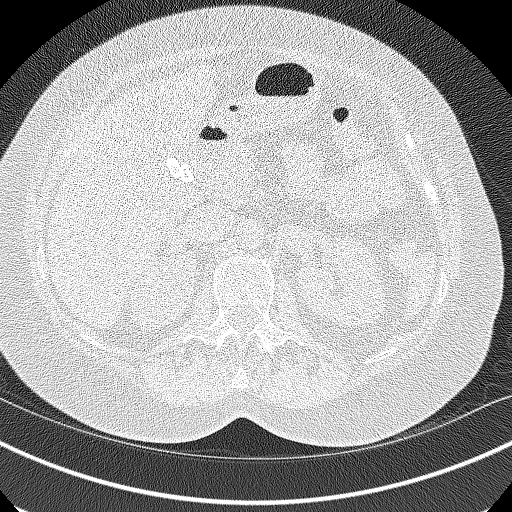
[im 45/325  lung]
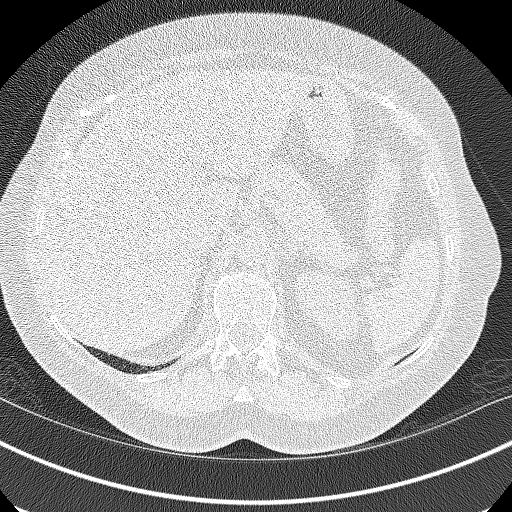
[im 74/325  lung]
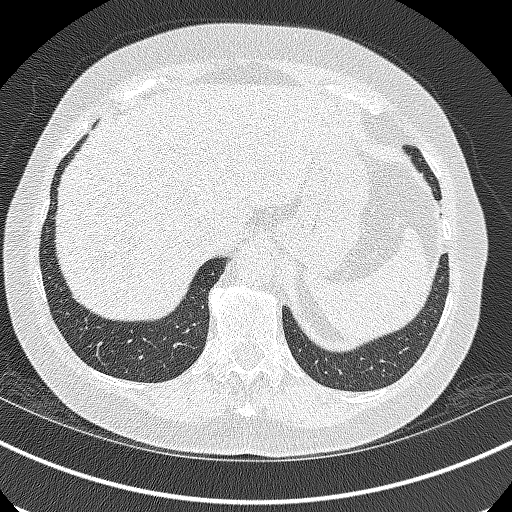
[im 104/325  lung]
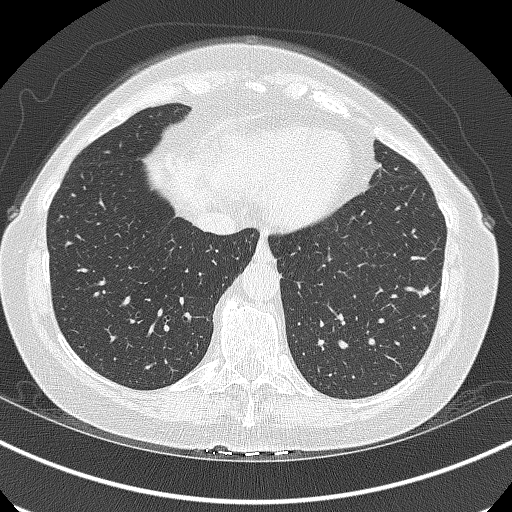
[im 118/325  mediastinal]
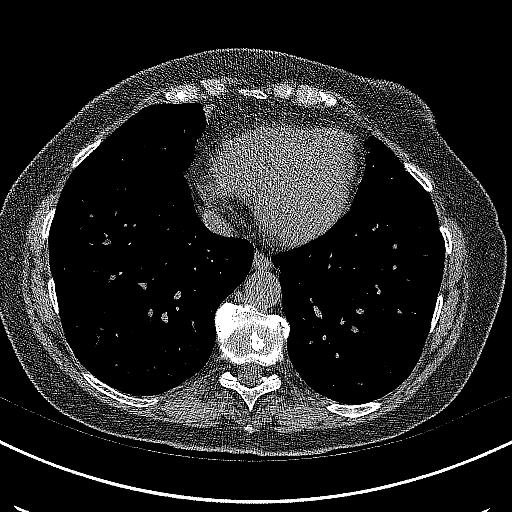
[im 118/325  lung]
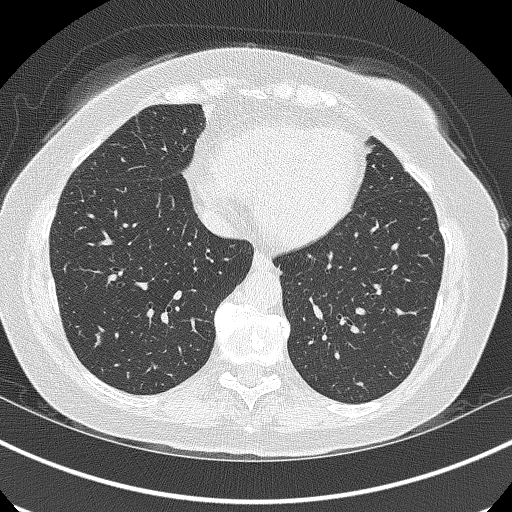
[im 148/325  lung]
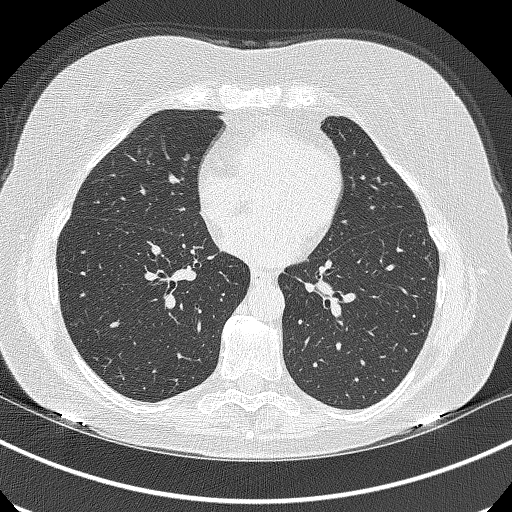
[im 177/325  lung]
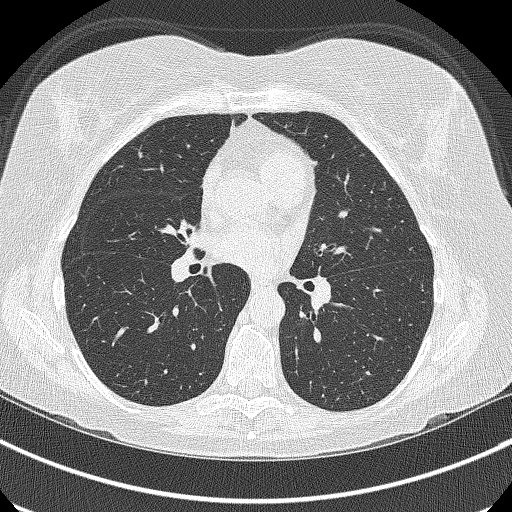
[im 207/325  lung]
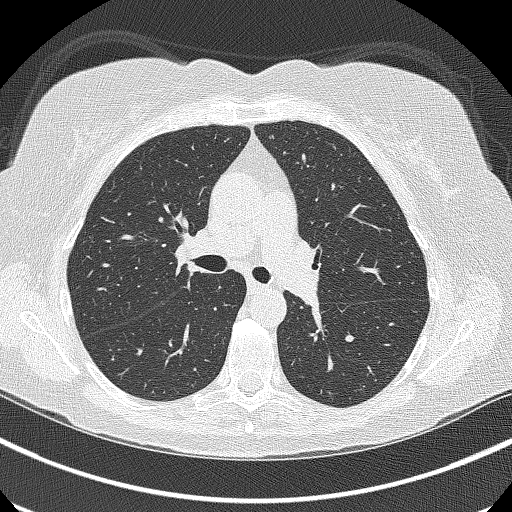
[im 221/325  mediastinal]
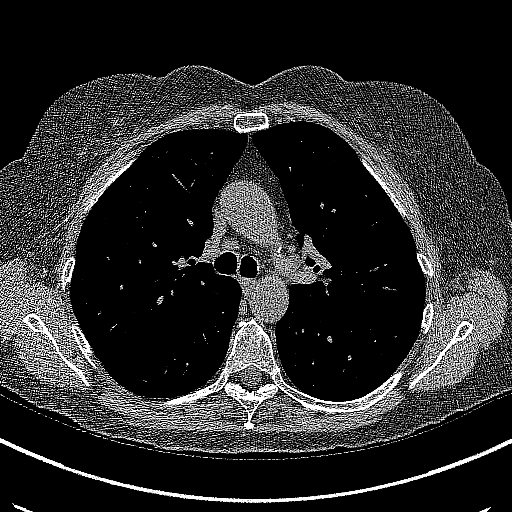
[im 221/325  lung]
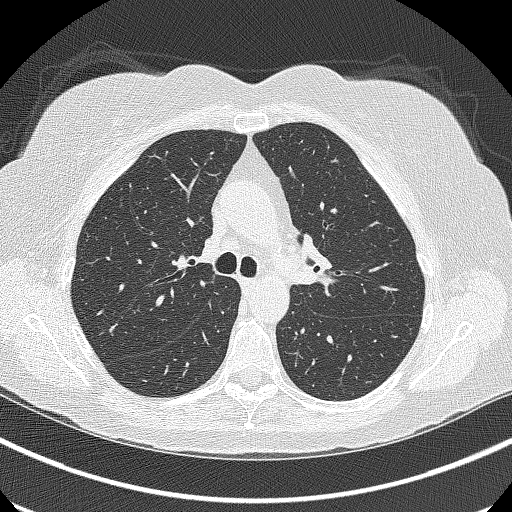
[im 251/325  lung]
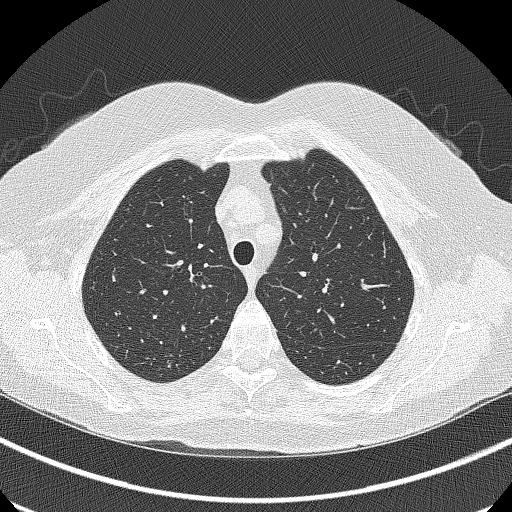
[im 280/325  lung]
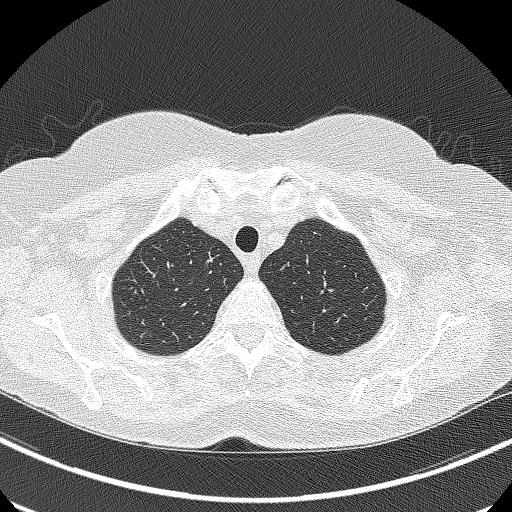
[im 310/325  lung]
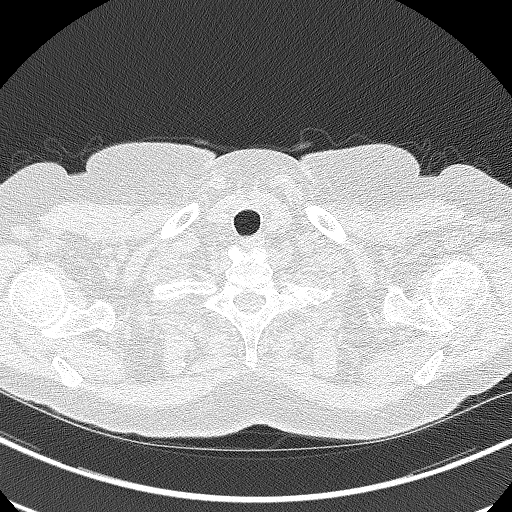

[Series 4: coronals lung 1.00 cor · coronal · 0.64mm/px · 3 of 326 slices shown]
[im 66/326  lung]
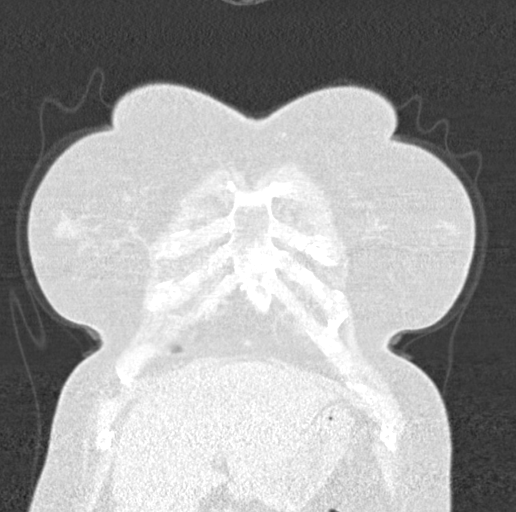
[im 131/326  lung]
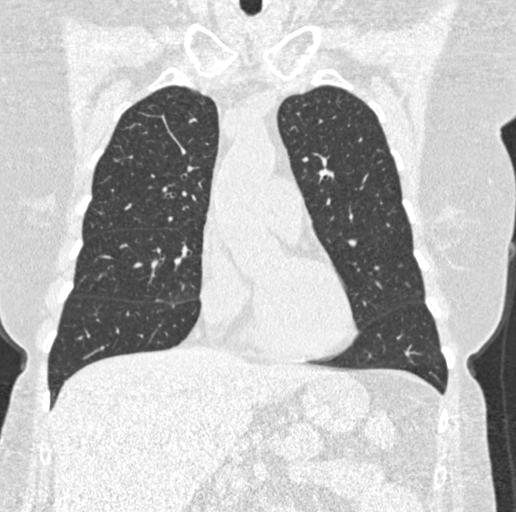
[im 196/326  lung]
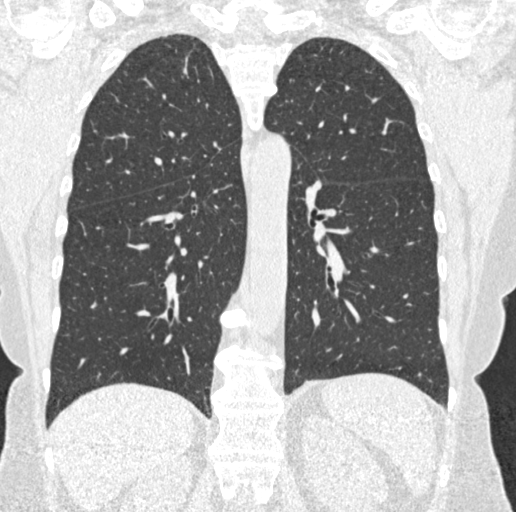

[15 of 40 positions shown; findings below may reference images not displayed]

FINDINGS: Cardiovascular: Normal heart size. No significant pericardial
effusion/thickening. Atherosclerotic nonaneurysmal thoracic aorta.
Normal caliber pulmonary arteries.

Mediastinum/Nodes: No discrete thyroid nodules. Unremarkable
esophagus. No pathologically enlarged axillary, mediastinal or hilar
lymph nodes, noting limited sensitivity for the detection of hilar
adenopathy on this noncontrast study.

Lungs/Pleura: No pneumothorax. No pleural effusion. Normal
centrilobular emphysema. No acute consolidative airspace disease or
lung masses. No significant growth of any of the numerous previously
visualized scattered small pulmonary nodules. No new significant
pulmonary nodules.

Upper abdomen: Cholelithiasis. Subcentimeter hypodense anterior left
liver lesion is too small to characterize and is unchanged,
considered benign.

Musculoskeletal: No aggressive appearing focal osseous lesions.
Moderate thoracic spondylosis.
IMPRESSION: 1. Lung-RADS 2, benign appearance or behavior. Continue annual
screening with low-dose chest CT without contrast in 12 months.
2. Cholelithiasis.
3. Aortic Atherosclerosis (9NY60-RSK.K) and Emphysema (9NY60-JG2.3).

## 2020-12-19 ENCOUNTER — Other Ambulatory Visit: Payer: Self-pay

## 2020-12-19 MED ORDER — PRAVASTATIN SODIUM 20 MG PO TABS: 20.0000 mg | ORAL_TABLET | Freq: Every day | ORAL | 3 refills | Status: DC

## 2021-02-11 ENCOUNTER — Telehealth: Payer: Self-pay | Admitting: Family

## 2021-02-11 NOTE — Telephone Encounter (Signed)
Left message for patient to call back and schedule Medicare Annual Wellness Visit (AWV) in office.   If not able to come in office, please offer to do virtually or by telephone.   Last AWV:03/31/2019  Please schedule at anytime with Nurse Health Advisor.

## 2021-03-07 ENCOUNTER — Ambulatory Visit: Payer: Medicare HMO

## 2021-03-20 ENCOUNTER — Ambulatory Visit: Payer: Medicare HMO | Admitting: Dermatology

## 2021-03-20 ENCOUNTER — Other Ambulatory Visit: Payer: Self-pay

## 2021-03-20 ENCOUNTER — Encounter: Payer: Self-pay | Admitting: Dermatology

## 2021-03-20 DIAGNOSIS — Z86018 Personal history of other benign neoplasm: Secondary | ICD-10-CM | POA: Diagnosis not present

## 2021-03-20 DIAGNOSIS — L820 Inflamed seborrheic keratosis: Secondary | ICD-10-CM | POA: Diagnosis not present

## 2021-03-20 DIAGNOSIS — D1800 Hemangioma unspecified site: Secondary | ICD-10-CM

## 2021-03-20 DIAGNOSIS — L570 Actinic keratosis: Secondary | ICD-10-CM

## 2021-03-20 DIAGNOSIS — L814 Other melanin hyperpigmentation: Secondary | ICD-10-CM

## 2021-03-20 DIAGNOSIS — L578 Other skin changes due to chronic exposure to nonionizing radiation: Secondary | ICD-10-CM | POA: Diagnosis not present

## 2021-03-20 DIAGNOSIS — Z1283 Encounter for screening for malignant neoplasm of skin: Secondary | ICD-10-CM

## 2021-03-20 DIAGNOSIS — D485 Neoplasm of uncertain behavior of skin: Secondary | ICD-10-CM | POA: Diagnosis not present

## 2021-03-20 DIAGNOSIS — L237 Allergic contact dermatitis due to plants, except food: Secondary | ICD-10-CM | POA: Diagnosis not present

## 2021-03-20 DIAGNOSIS — L821 Other seborrheic keratosis: Secondary | ICD-10-CM | POA: Diagnosis not present

## 2021-03-20 DIAGNOSIS — D229 Melanocytic nevi, unspecified: Secondary | ICD-10-CM | POA: Diagnosis not present

## 2021-03-20 MED ORDER — MOMETASONE FUROATE 0.1 % EX CREA: TOPICAL_CREAM | CUTANEOUS | 0 refills | Status: DC

## 2021-03-20 NOTE — Progress Notes (Signed)
Follow-Up Visit   Subjective  Tara Coleman is a 70 y.o. female who presents for the following: Annual Exam (Patient presents for TBSE. No spots of concern today. She has a history of dysplastic nevus of the back. No history of skin cancer. ). She has a few pink spots on her legs/body from recent poison ivy exposure, using OTC anti-itch cream. The patient presents for Total-Body Skin Exam (TBSE) for skin cancer screening and mole check.  The following portions of the chart were reviewed this encounter and updated as appropriate:   Tobacco  Allergies  Meds  Problems  Med Hx  Surg Hx  Fam Hx     Review of Systems:  No other skin or systemic complaints except as noted in HPI or Assessment and Plan.  Objective  Well appearing patient in no apparent distress; mood and affect are within normal limits.  A full examination was performed including scalp, head, eyes, ears, nose, lips, neck, chest, axillae, abdomen, back, buttocks, bilateral upper extremities, bilateral lower extremities, hands, feet, fingers, toes, fingernails, and toenails. All findings within normal limits unless otherwise noted below.  Left posterior flank Stuck-on waxy brown plaque   face x 3 (3) Erythematous thin papules/macules with gritty scale.   legs, back Pink papules.   Assessment & Plan  Lentigines - Scattered tan macules - Due to sun exposure - Benign-appering, observe - Recommend daily broad spectrum sunscreen SPF 30+ to sun-exposed areas, reapply every 2 hours as needed. - Call for any changes  Seborrheic Keratoses - Stuck-on, waxy, tan-brown papules and/or plaques  - Benign-appearing - Discussed benign etiology and prognosis. - Observe - Call for any changes  Melanocytic Nevi - Tan-brown and/or pink-flesh-colored symmetric macules and papules - Benign appearing on exam today - Observation - Call clinic for new or changing moles - Recommend daily use of broad spectrum spf 30+  sunscreen to sun-exposed areas.   Hemangiomas - Red papules - Discussed benign nature - Observe - Call for any changes  Actinic Damage - Chronic condition, secondary to cumulative UV/sun exposure - diffuse scaly erythematous macules with underlying dyspigmentation - Recommend daily broad spectrum sunscreen SPF 30+ to sun-exposed areas, reapply every 2 hours as needed.  - Staying in the shade or wearing long sleeves, sun glasses (UVA+UVB protection) and wide brim hats (4-inch brim around the entire circumference of the hat) are also recommended for sun protection.  - Call for new or changing lesions.  Skin cancer screening performed today. History of Dysplastic Nevus - No evidence of recurrence today of the mid back - Recommend regular full body skin exams - Recommend daily broad spectrum sunscreen SPF 30+ to sun-exposed areas, reapply every 2 hours as needed.  - Call if any new or changing lesions are noted between office visits  Neoplasm of uncertain behavior of skin Left posterior flank  Epidermal / dermal shaving  Lesion diameter (cm):  1.5 Informed consent: discussed and consent obtained   Patient was prepped and draped in usual sterile fashion: area prepped with alcohol. Anesthesia: the lesion was anesthetized in a standard fashion   Anesthetic:  1% lidocaine w/ epinephrine 1-100,000 buffered w/ 8.4% NaHCO3 Instrument used: flexible razor blade   Hemostasis achieved with: pressure, aluminum chloride and electrodesiccation   Outcome: patient tolerated procedure well   Post-procedure details: wound care instructions given   Post-procedure details comment:  Ointment and small bandage applied  Specimen 1 - Surgical pathology Differential Diagnosis: Inflamed SK vs other Check Margins: No  Stuck-on waxy brown plaque 1.5cm  AK (actinic keratosis) (3) face x 3  Actinic keratoses are precancerous spots that appear secondary to cumulative UV radiation exposure/sun exposure  over time. They are chronic with expected duration over 1 year. A portion of actinic keratoses will progress to squamous cell carcinoma of the skin. It is not possible to reliably predict which spots will progress to skin cancer and so treatment is recommended to prevent development of skin cancer.  Recommend daily broad spectrum sunscreen SPF 30+ to sun-exposed areas, reapply every 2 hours as needed.  Recommend staying in the shade or wearing long sleeves, sun glasses (UVA+UVB protection) and wide brim hats (4-inch brim around the entire circumference of the hat). Call for new or changing lesions.  Destruction of lesion - face x 3 Complexity: simple   Destruction method: cryotherapy   Informed consent: discussed and consent obtained   Timeout:  patient name, date of birth, surgical site, and procedure verified Lesion destroyed using liquid nitrogen: Yes   Region frozen until ice ball extended beyond lesion: Yes   Outcome: patient tolerated procedure well with no complications   Post-procedure details: wound care instructions given    Allergic contact dermatitis due to plants, except food legs, back  Start mometasone cream Apply to Aas rash qd/bid up to 5 days a week dsp 50g 0Rf.  Topical steroids (such as triamcinolone, fluocinolone, fluocinonide, mometasone, clobetasol, halobetasol, betamethasone, hydrocortisone) can cause thinning and lightening of the skin if they are used for too long in the same area. Your physician has selected the right strength medicine for your problem and area affected on the body. Please use your medication only as directed by your physician to prevent side effects.    mometasone (ELOCON) 0.1 % cream - legs, back Apply to affected areas itchy rash 1-2 times a day up to 5 days a week until improved. Avoid face, groin, axilla.  Skin cancer screening  Return in about 1 year (around 03/20/2022) for TBSE.  Lindi Adie, CMA, am acting as scribe for Sarina Ser, MD . Documentation: I have reviewed the above documentation for accuracy and completeness, and I agree with the above.  Sarina Ser, MD

## 2021-03-20 NOTE — Patient Instructions (Addendum)
Wound Care Instructions  Cleanse wound gently with soap and water once a day then pat dry with clean gauze. Apply a thing coat of Petrolatum (petroleum jelly, "Vaseline") over the wound (unless you have an allergy to this). We recommend that you use a new, sterile tube of Vaseline. Do not pick or remove scabs. Do not remove the yellow or white "healing tissue" from the base of the wound.  Cover the wound with fresh, clean, nonstick gauze and secure with paper tape. You may use Band-Aids in place of gauze and tape if the would is small enough, but would recommend trimming much of the tape off as there is often too much. Sometimes Band-Aids can irritate the skin.  You should call the office for your biopsy report after 1 week if you have not already been contacted.  If you experience any problems, such as abnormal amounts of bleeding, swelling, significant bruising, significant pain, or evidence of infection, please call the office immediately.  FOR ADULT SURGERY PATIENTS: If you need something for pain relief you may take 1 extra strength Tylenol (acetaminophen) AND 2 Ibuprofen (200mg each) together every 4 hours as needed for pain. (do not take these if you are allergic to them or if you have a reason you should not take them.) Typically, you may only need pain medication for 1 to 3 days.   If you have any questions or concerns for your doctor, please call our main line at [phone] and press option 4 to reach your doctor's medical assistant. If no one answers, please leave a voicemail as directed and we will return your call as soon as possible. Messages left after 4 pm will be answered the following business day.   You may also send us a message via MyChart. We typically respond to MyChart messages within 1-2 business days.  For prescription refills, please ask your pharmacy to contact our office. Our fax number is [phone].  If you have an urgent issue when the clinic is closed that  cannot wait until the next business day, you can page your doctor at the number below.    Please note that while we do our best to be available for urgent issues outside of office hours, we are not available 24/7.   If you have an urgent issue and are unable to reach us, you may choose to seek medical care at your doctor's office, retail clinic, urgent care center, or emergency room.  If you have a medical emergency, please immediately call 911 or go to the emergency department.  Pager Numbers  - Dr. Kowalski: [phone]  - Dr. Moye: [phone]  - Dr. Stewart: [phone]  In the event of inclement weather, please call our main line at [phone] for an update on the status of any delays or closures.  Dermatology Medication Tips: Please keep the boxes that topical medications come in in order to help keep track of the instructions about where and how to use these. Pharmacies typically print the medication instructions only on the boxes and not directly on the medication tubes.   If your medication is too expensive, please contact our office at [phone] option 4 or send us a message through MyChart.   We are unable to tell what your co-pay for medications will be in advance as this is different depending on your insurance coverage. However, we may be able to find a substitute medication at lower cost or fill out paperwork to get insurance to cover a needed   medication.   If a prior authorization is required to get your medication covered by your insurance company, please allow us 1-2 business days to complete this process.  Drug prices often vary depending on where the prescription is filled and some pharmacies may offer cheaper prices.  The website www.goodrx.com contains coupons for medications through different pharmacies. The prices here do not account for what the cost may be with help from insurance (it may be cheaper with your insurance), but the website can give you the  price if you did not use any insurance.  - You can print the associated coupon and take it with your prescription to the pharmacy.  - You may also stop by our office during regular business hours and pick up a GoodRx coupon card.  - If you need your prescription sent electronically to a different pharmacy, notify our office through [place] MyChart or by phone at [phone] option 4.   

## 2021-03-25 ENCOUNTER — Telehealth: Payer: Self-pay

## 2021-03-25 NOTE — Telephone Encounter (Signed)
-----   Message from Ralene Bathe, MD sent at 03/24/2021  6:09 PM EDT ----- Diagnosis Skin , left posterior flank SEBORRHEIC KERATOSIS, IRRITATED  Benign irritated keratosis No further treatment needed

## 2021-03-25 NOTE — Telephone Encounter (Signed)
Left pt msg to call for bx results 

## 2021-03-26 NOTE — Telephone Encounter (Signed)
Spoke with pt and informed her of results. She had no concerns.

## 2021-04-30 ENCOUNTER — Ambulatory Visit (INDEPENDENT_AMBULATORY_CARE_PROVIDER_SITE_OTHER): Payer: Medicare HMO

## 2021-04-30 VITALS — Ht 67.0 in | Wt 180.0 lb

## 2021-04-30 DIAGNOSIS — Z0000 Encounter for general adult medical examination without abnormal findings: Secondary | ICD-10-CM

## 2021-04-30 NOTE — Patient Instructions (Addendum)
Tara Coleman , Thank you for taking time to come for your Medicare Wellness Visit. I appreciate your ongoing commitment to your health goals. Please review the following plan we discussed and let me know if I can assist you in the future.   These are the goals we discussed:  Goals      Maintain Healthy Lifestyle     Healthy diet Stay active        This is a list of the screening recommended for you and due dates:  Health Maintenance  Topic Date Due   Zoster (Shingles) Vaccine (2 of 2) 04/24/2021   Colon Cancer Screening  07/17/2021*   Flu Shot  11/14/2021*   Pneumonia vaccines (2 of 2 - PPSV23) 09/10/2021   Mammogram  08/23/2022   Tetanus Vaccine  08/27/2026   DEXA scan (bone density measurement)  Completed   COVID-19 Vaccine  Completed   Hepatitis C Screening: USPSTF Recommendation to screen - Ages 65-79 yo.  Completed   HPV Vaccine  Aged Out  *Topic was postponed. The date shown is not the original due date.    Advanced directives: End of life planning; Advance aging; Advanced directives discussed.  Copy of current HCPOA/Living Will requested.    Conditions/risks identified: none new  Follow up in one year for your annual wellness visit    Preventive Care 65 Years and Older, Female Preventive care refers to lifestyle choices and visits with your health care provider that can promote health and wellness. What does preventive care include? A yearly physical exam. This is also called an annual well check. Dental exams once or twice a year. Routine eye exams. Ask your health care provider how often you should have your eyes checked. Personal lifestyle choices, including: Daily care of your teeth and gums. Regular physical activity. Eating a healthy diet. Avoiding tobacco and drug use. Limiting alcohol use. Practicing safe sex. Taking low-dose aspirin every day. Taking vitamin and mineral supplements as recommended by your health care provider. What happens during an  annual well check? The services and screenings done by your health care provider during your annual well check will depend on your age, overall health, lifestyle risk factors, and family history of disease. Counseling  Your health care provider may ask you questions about your: Alcohol use. Tobacco use. Drug use. Emotional well-being. Home and relationship well-being. Sexual activity. Eating habits. History of falls. Memory and ability to understand (cognition). Work and work Statistician. Reproductive health. Screening  You may have the following tests or measurements: Height, weight, and BMI. Blood pressure. Lipid and cholesterol levels. These may be checked every 5 years, or more frequently if you are over 22 years old. Skin check. Lung cancer screening. You may have this screening every year starting at age 35 if you have a 30-pack-year history of smoking and currently smoke or have quit within the past 15 years. Fecal occult blood test (FOBT) of the stool. You may have this test every year starting at age 37. Flexible sigmoidoscopy or colonoscopy. You may have a sigmoidoscopy every 5 years or a colonoscopy every 10 years starting at age 34. Hepatitis C blood test. Hepatitis B blood test. Sexually transmitted disease (STD) testing. Diabetes screening. This is done by checking your blood sugar (glucose) after you have not eaten for a while (fasting). You may have this done every 1-3 years. Bone density scan. This is done to screen for osteoporosis. You may have this done starting at age 17. Mammogram. This may  be done every 1-2 years. Talk to your health care provider about how often you should have regular mammograms. Talk with your health care provider about your test results, treatment options, and if necessary, the need for more tests. Vaccines  Your health care provider may recommend certain vaccines, such as: Influenza vaccine. This is recommended every year. Tetanus,  diphtheria, and acellular pertussis (Tdap, Td) vaccine. You may need a Td booster every 10 years. Zoster vaccine. You may need this after age 2. Pneumococcal 13-valent conjugate (PCV13) vaccine. One dose is recommended after age 96. Pneumococcal polysaccharide (PPSV23) vaccine. One dose is recommended after age 53. Talk to your health care provider about which screenings and vaccines you need and how often you need them. This information is not intended to replace advice given to you by your health care provider. Make sure you discuss any questions you have with your health care provider. Document Released: 08/30/2015 Document Revised: 04/22/2016 Document Reviewed: 06/04/2015 Elsevier Interactive Patient Education  2017 [place] Prevention in the Home Falls can cause injuries. They can happen to people of all ages. There are many things you can do to make your home safe and to help prevent falls. What can I do on the outside of my home? Regularly fix the edges of walkways and driveways and fix any cracks. Remove anything that might make you trip as you walk through a door, such as a raised step or threshold. Trim any bushes or trees on the path to your home. Use bright outdoor lighting. Clear any walking paths of anything that might make someone trip, such as rocks or tools. Regularly check to see if handrails are loose or broken. Make sure that both sides of any steps have handrails. Any raised decks and porches should have guardrails on the edges. Have any leaves, snow, or ice cleared regularly. Use sand or salt on walking paths during winter. Clean up any spills in your garage right away. This includes oil or grease spills. What can I do in the bathroom? Use night lights. Install grab bars by the toilet and in the tub and shower. Do not use towel bars as grab bars. Use non-skid mats or decals in the tub or shower. If you need to sit down in the shower, use a plastic,  non-slip stool. Keep the floor dry. Clean up any water that spills on the floor as soon as it happens. Remove soap buildup in the tub or shower regularly. Attach bath mats securely with double-sided non-slip rug tape. Do not have throw rugs and other things on the floor that can make you trip. What can I do in the bedroom? Use night lights. Make sure that you have a light by your bed that is easy to reach. Do not use any sheets or blankets that are too big for your bed. They should not hang down onto the floor. Have a firm chair that has side arms. You can use this for support while you get dressed. Do not have throw rugs and other things on the floor that can make you trip. What can I do in the kitchen? Clean up any spills right away. Avoid walking on wet floors. Keep items that you use a lot in easy-to-reach places. If you need to reach something above you, use a strong step stool that has a grab bar. Keep electrical cords out of the way. Do not use floor polish or wax that makes floors slippery. If you must use  wax, use non-skid floor wax. Do not have throw rugs and other things on the floor that can make you trip. What can I do with my stairs? Do not leave any items on the stairs. Make sure that there are handrails on both sides of the stairs and use them. Fix handrails that are broken or loose. Make sure that handrails are as long as the stairways. Check any carpeting to make sure that it is firmly attached to the stairs. Fix any carpet that is loose or worn. Avoid having throw rugs at the top or bottom of the stairs. If you do have throw rugs, attach them to the floor with carpet tape. Make sure that you have a light switch at the top of the stairs and the bottom of the stairs. If you do not have them, ask someone to add them for you. What else can I do to help prevent falls? Wear shoes that: Do not have high heels. Have rubber bottoms. Are comfortable and fit you well. Are closed  at the toe. Do not wear sandals. If you use a stepladder: Make sure that it is fully opened. Do not climb a closed stepladder. Make sure that both sides of the stepladder are locked into place. Ask someone to hold it for you, if possible. Clearly mark and make sure that you can see: Any grab bars or handrails. First and last steps. Where the edge of each step is. Use tools that help you move around (mobility aids) if they are needed. These include: Canes. Walkers. Scooters. Crutches. Turn on the lights when you go into a dark area. Replace any light bulbs as soon as they burn out. Set up your furniture so you have a clear path. Avoid moving your furniture around. If any of your floors are uneven, fix them. If there are any pets around you, be aware of where they are. Review your medicines with your doctor. Some medicines can make you feel dizzy. This can increase your chance of falling. Ask your doctor what other things that you can do to help prevent falls. This information is not intended to replace advice given to you by your health care provider. Make sure you discuss any questions you have with your health care provider. Document Released: 05/30/2009 Document Revised: 01/09/2016 Document Reviewed: 09/07/2014 Elsevier Interactive Patient Education  2017 [employer].

## 2021-04-30 NOTE — Progress Notes (Signed)
Subjective:   Tara Coleman is a 70 y.o. female who presents for Medicare Annual (Subsequent) preventive examination.  Review of Systems    No ROS.  Medicare Wellness Virtual Visit.  Visual/audio telehealth visit, UTA vital signs.   See social history for additional risk factors.   Cardiac Risk Factors include: advanced age (>49mn, >>69women)     Objective:    Today's Vitals   04/30/21 1543  Weight: 180 lb (81.6 kg)  Height: '5\' 7"'$  (1.702 m)   Body mass index is 28.19 kg/m.  Advanced Directives 04/30/2021 03/31/2019  Does Patient Have a Medical Advance Directive? Yes Yes  Type of [place] Does patient want to make changes to medical advance directive? No - Patient declined No - Patient declined  [place] of [place]? No - copy requested No - copy requested    Current Medications (verified) Outpatient Encounter Medications as of 04/30/2021  Medication Sig   levothyroxine (SYNTHROID) 100 MCG tablet TAKE ONE TABLET BY MOUTH EVERY MORNING 30 MINUTES BEFORE BREAKFAST   pravastatin (PRAVACHOL) 20 MG tablet Take 1 tablet (20 mg total) by mouth daily.   [DISCONTINUED] mometasone (ELOCON) 0.1 % cream Apply to affected areas itchy rash 1-2 times a day up to 5 days a week until improved. Avoid face, groin, axilla.   [DISCONTINUED] PFIZER-BIONT COVID-19 VAC-TRIS SUSP injection  (Patient not taking: Reported on 04/30/2021)   [DISCONTINUED] SHINGRIX injection    No facility-administered encounter medications on file as of 04/30/2021.    Allergies (verified) Patient has no known allergies.   History: Past Medical History:  Diagnosis Date   Dysplastic nevus 11/15/2013   mid back - mild    Thyroid disease    Past Surgical History:  Procedure Laterality Date   ADENOIDECTOMY     as a child   CESAREAN SECTION     x2    Family History  Problem Relation Age of Onset    Cancer Mother 636      biliary duct   Thyroid disease Sister    Psoriasis Son    Colon cancer Maternal Grandfather        lived to 853  Breast cancer Neg Hx    Social History   Socioeconomic History   Marital status: Married    Spouse name: Not on file   Number of children: 2   Years of education: Not on file   Highest education level: Not on file  Occupational History   Occupation: Self Employed - Tennis  Tobacco Use   Smoking status: Former    Packs/day: 1.00    Years: 30.00    Pack years: 30.00    Types: Cigarettes    Quit date: 04/17/2018    Years since quitting: 3.0   Smokeless tobacco: Never  Substance and Sexual Activity   Alcohol use: Yes    Alcohol/week: 0.0 standard drinks    Comment: Glass wine hs   Drug use: No   Sexual activity: Not on file  Other Topics Concern   Not on file  Social History Narrative   Tennis line rep   Two children   Social Determinants of Health   Financial Resource Strain: Low Risk    Difficulty of Paying Living Expenses: Not hard at all  Food Insecurity: No Food Insecurity   Worried About RCharity fundraiserin the Last Year: Never true   Ran  Out of Food in the Last Year: Never true  Transportation Needs: No Transportation Needs   Lack of Transportation (Medical): No   Lack of Transportation (Non-Medical): No  Physical Activity: Sufficiently Active   Days of Exercise per Week: 7 days   Minutes of Exercise per Session: 120 min  Stress: No Stress Concern Present   Feeling of Stress : Not at all  Social Connections: Unknown   Frequency of Communication with Friends and Family: Not on file   Frequency of Social Gatherings with Friends and Family: Not on file   Attends Religious Services: Not on Electrical engineer or Organizations: Not on file   Attends Archivist Meetings: Not on file   Marital Status: Married    Tobacco Counseling Counseling given: Not Answered   Clinical Intake:  Pre-visit  preparation completed: Yes        Diabetes: No  How often do you need to have someone help you when you read instructions, pamphlets, or other written materials from your doctor or pharmacy?: 1 - Never    Interpreter Needed?: No    Activities of Daily Living In your present state of health, do you have any difficulty performing the following activities: 04/30/2021  Hearing? N  Vision? N  Difficulty concentrating or making decisions? N  Walking or climbing stairs? N  Dressing or bathing? N  Doing errands, shopping? N  Preparing Food and eating ? N  Using the Toilet? N  In the past six months, have you accidently leaked urine? N  Do you have problems with loss of bowel control? N  Managing your Medications? N  Managing your Finances? N  Some recent data might be hidden    Patient Care Team: Burnard Hawthorne, FNP as PCP - General (Family Medicine)  Indicate any recent Medical Services you may have received from other than [facility] providers in the past year (date may be approximate).     Assessment:   This is a routine wellness examination for Tara Coleman.  I connected with Tara Coleman today by telephone and verified that I am speaking with the correct person using two identifiers. Location patient: home Location provider: work Persons participating in the virtual visit: patient, Marine scientist.    I discussed the limitations, risks, security and privacy concerns of performing an evaluation and management service by telephone and the availability of in person appointments. The patient expressed understanding and verbally consented to this telephonic visit.    Interactive audio and video telecommunications were attempted between this provider and patient, however failed, due to patient having technical difficulties OR patient did not have access to video capability.  We continued and completed visit with audio only.  Some vital signs may be absent or patient reported.    Hearing/Vision screen Hearing Screening - Comments:: Patient is able to hear conversational tones without difficulty.  No issues reported. Vision Screening - Comments:: Wears corrective lenses  They have seen their ophthalmologist in the last 12 months.   Dietary issues and exercise activities discussed: Current Exercise Habits: Home exercise routine, Type of exercise: walking, Intensity: Mild Healthy diet Good water intake   Goals Addressed               This Visit's Progress     Patient Stated     COMPLETED: Weight (lb) < 195 lb (88.5 kg) (pt-stated)   180 lb (81.6 kg)     Other     Maintain Healthy Lifestyle  Healthy diet Stay active       Depression Screen PHQ 2/9 Scores 04/30/2021 01/02/2020 03/31/2019 09/05/2018 09/05/2018 04/28/2018 08/26/2016  PHQ - 2 Score 0 0 0 0 0 0 0  PHQ- 9 Score - 2 - 0 1 - -    Fall Risk Fall Risk  04/30/2021 01/02/2020 03/31/2019 04/28/2018 08/26/2016  Falls in the past year? 0 0 0 No No  Number falls in past yr: 0 - - - -  Injury with Fall? 0 - - - -  Follow up Falls evaluation completed Falls evaluation completed - - -   FALL RISK PREVENTION PERTAINING TO THE HOME: Adequate lighting in your home to reduce risk of falls? Yes   ASSISTIVE DEVICES UTILIZED TO PREVENT FALLS: Use of a cane, walker or w/c? No   TIMED UP AND GO: Was the test performed? No .   Cognitive Function:  Patient is alert and oriented x3.    6CIT Screen 03/31/2019  What Year? 0 points  What month? 0 points  What time? 0 points  Count back from 20 0 points  Months in reverse 0 points  Repeat phrase 0 points  Total Score 0    Immunizations Immunization History  Administered Date(s) Administered   Influenza, Quadrivalent, Recombinant, Inj, Pf 05/08/2019   Influenza-Unspecified 05/17/2020   PFIZER(Purple Top)SARS-COV-2 Vaccination 08/21/2019, 09/11/2019, 05/24/2020, 11/19/2020, 04/28/2021   Pneumococcal Conjugate-13 11/10/2017, 05/08/2019    Pneumococcal Polysaccharide-23 09/10/2016   Tdap 08/05/2005, 08/27/2016   Zoster Recombinat (Shingrix) 02/27/2021   Zoster, Live 07/28/2013   Shingrix vaccine- awaiting second vaccine in series.   Health Maintenance Health Maintenance  Topic Date Due   Zoster Vaccines- Shingrix (2 of 2) 04/24/2021   COLONOSCOPY (Pts 45-7yr Insurance coverage will need to be confirmed)  07/17/2021 (Originally 09/02/2020)   INFLUENZA VACCINE  11/14/2021 (Originally 03/17/2021)   PNA vac Low Risk Adult (2 of 2 - PPSV23) 09/10/2021   MAMMOGRAM  08/23/2022   TETANUS/TDAP  08/27/2026   DEXA SCAN  Completed   COVID-19 Vaccine  Completed   Hepatitis C Screening  Completed   HPV VACCINES  Aged Out   Vision Screening: Recommended annual ophthalmology exams for early detection of glaucoma and other disorders of the eye.  Dental Screening: Recommended annual dental exams for proper oral hygiene  Community Resource Referral / Chronic Care Management: CRR required this visit?  No   CCM required this visit?  No      Plan:   Keep all routine maintenance appointments.   I have personally reviewed and noted the following in the patient's chart:   Medical and social history Use of alcohol, tobacco or illicit drugs  Current medications and supplements including opioid prescriptions. Not taking opioid.  Functional ability and status Nutritional status Physical activity Advanced directives List of other physicians Hospitalizations, surgeries, and ER visits in previous 12 months Vitals Screenings to include cognitive, depression, and falls Referrals and appointments  In addition, I have reviewed and discussed with patient certain preventive protocols, quality metrics, and best practice recommendations. A written personalized care plan for preventive services as well as general preventive health recommendations were provided to patient via mychart.     OVarney Biles LPN   [id number]

## 2021-05-25 ENCOUNTER — Other Ambulatory Visit: Payer: Self-pay | Admitting: Family

## 2021-05-27 DIAGNOSIS — H524 Presbyopia: Secondary | ICD-10-CM | POA: Diagnosis not present

## 2021-05-27 DIAGNOSIS — Z0100 Encounter for examination of eyes and vision without abnormal findings: Secondary | ICD-10-CM | POA: Diagnosis not present

## 2021-07-08 DIAGNOSIS — E559 Vitamin D deficiency, unspecified: Secondary | ICD-10-CM | POA: Diagnosis not present

## 2021-07-08 DIAGNOSIS — Z86010 Personal history of colonic polyps: Secondary | ICD-10-CM | POA: Diagnosis not present

## 2021-09-18 ENCOUNTER — Other Ambulatory Visit: Payer: Self-pay | Admitting: Family

## 2021-09-18 DIAGNOSIS — Z1231 Encounter for screening mammogram for malignant neoplasm of breast: Secondary | ICD-10-CM

## 2021-09-26 ENCOUNTER — Telehealth: Payer: Self-pay | Admitting: Acute Care

## 2021-09-26 NOTE — Telephone Encounter (Signed)
Left voicemail and call back number to schedule annual LDCT °

## 2021-10-21 ENCOUNTER — Other Ambulatory Visit: Payer: Self-pay

## 2021-10-21 ENCOUNTER — Ambulatory Visit
Admission: RE | Admit: 2021-10-21 | Discharge: 2021-10-21 | Disposition: A | Discharge status: Home / Self Care | Payer: No Typology Code available for payment source | Source: Ambulatory Visit | Attending: Family | Admitting: Family

## 2021-10-21 DIAGNOSIS — Z1231 Encounter for screening mammogram for malignant neoplasm of breast: Secondary | ICD-10-CM | POA: Diagnosis not present

## 2021-11-19 ENCOUNTER — Other Ambulatory Visit: Payer: Self-pay | Admitting: Family

## 2021-12-15 ENCOUNTER — Other Ambulatory Visit: Payer: Self-pay | Admitting: Family

## 2021-12-18 ENCOUNTER — Encounter: Payer: Self-pay | Admitting: *Deleted

## 2021-12-19 ENCOUNTER — Encounter: Payer: Self-pay | Admitting: *Deleted

## 2021-12-19 ENCOUNTER — Ambulatory Visit
Admission: RE | Admit: 2021-12-19 | Discharge: 2021-12-19 | Disposition: A | Discharge status: Home / Self Care | Payer: No Typology Code available for payment source | Attending: Gastroenterology | Admitting: Gastroenterology

## 2021-12-19 ENCOUNTER — Encounter
Admission: RE | Disposition: A | Discharge status: Home / Self Care | Payer: Self-pay | Source: Home / Self Care | Attending: Gastroenterology

## 2021-12-19 ENCOUNTER — Ambulatory Visit: Payer: No Typology Code available for payment source | Admitting: Anesthesiology

## 2021-12-19 DIAGNOSIS — K640 First degree hemorrhoids: Secondary | ICD-10-CM | POA: Diagnosis not present

## 2021-12-19 DIAGNOSIS — Z87891 Personal history of nicotine dependence: Secondary | ICD-10-CM | POA: Insufficient documentation

## 2021-12-19 DIAGNOSIS — Z1211 Encounter for screening for malignant neoplasm of colon: Secondary | ICD-10-CM | POA: Diagnosis not present

## 2021-12-19 DIAGNOSIS — K649 Unspecified hemorrhoids: Secondary | ICD-10-CM | POA: Diagnosis not present

## 2021-12-19 DIAGNOSIS — E039 Hypothyroidism, unspecified: Secondary | ICD-10-CM | POA: Insufficient documentation

## 2021-12-19 DIAGNOSIS — K639 Disease of intestine, unspecified: Secondary | ICD-10-CM | POA: Diagnosis not present

## 2021-12-19 DIAGNOSIS — Z86010 Personal history of colonic polyps: Secondary | ICD-10-CM | POA: Insufficient documentation

## 2021-12-19 DIAGNOSIS — E785 Hyperlipidemia, unspecified: Secondary | ICD-10-CM | POA: Insufficient documentation

## 2021-12-19 DIAGNOSIS — K5730 Diverticulosis of large intestine without perforation or abscess without bleeding: Secondary | ICD-10-CM | POA: Diagnosis not present

## 2021-12-19 DIAGNOSIS — K635 Polyp of colon: Secondary | ICD-10-CM | POA: Diagnosis not present

## 2021-12-19 HISTORY — PX: COLONOSCOPY WITH PROPOFOL: SHX5780

## 2021-12-19 SURGERY — COLONOSCOPY WITH PROPOFOL
Anesthesia: General

## 2021-12-19 MED ORDER — PROPOFOL 10 MG/ML IV BOLUS
INTRAVENOUS | Status: DC | PRN
  Administered 2021-12-19: 50 mg via INTRAVENOUS

## 2021-12-19 MED ORDER — STERILE WATER FOR IRRIGATION IR SOLN
Status: DC | PRN
  Administered 2021-12-19 (×2): 60 mL

## 2021-12-19 MED ORDER — SODIUM CHLORIDE 0.9 % IV SOLN
INTRAVENOUS | Status: DC
  Administered 2021-12-19: 1000 mL via INTRAVENOUS

## 2021-12-19 MED ORDER — PROPOFOL 500 MG/50ML IV EMUL
INTRAVENOUS | Status: DC | PRN
  Administered 2021-12-19: 200 ug/kg/min via INTRAVENOUS

## 2021-12-19 NOTE — Interval H&P Note (Signed)
History and Physical Interval Note: ? ?12/19/2021 ?2:04 PM ? ?Cresta L Neils  has presented today for surgery, with the diagnosis of Z86.010  - Personal history of colonic polyps.  The various methods of treatment have been discussed with the patient and family. After consideration of risks, benefits and other options for treatment, the patient has consented to  Procedure(s): ?COLONOSCOPY WITH PROPOFOL (N/A) as a surgical intervention.  The patient's history has been reviewed, patient examined, no change in status, stable for surgery.  I have reviewed the patient's chart and labs.  Questions were answered to the patient's satisfaction.   ? ? ?Hilton Cork Tab Rylee ? ?Ok to proceed with colonoscopy ?

## 2021-12-19 NOTE — Anesthesia Procedure Notes (Signed)
Date/Time: 12/19/2021 2:21 PM ?Performed by: Nelda Marseille, CRNA ?Pre-anesthesia Checklist: Patient identified, Emergency Drugs available, Suction available, Patient being monitored and Timeout performed ?Oxygen Delivery Method: Nasal cannula ? ? ? ? ?

## 2021-12-19 NOTE — Op Note (Signed)
[facility] ?Gastroenterology ?Patient Name: Tara Coleman ?Procedure Date: 12/19/2021 2:04 PM ?MRN: [id number] ?Account #: [id number] ?Date of Birth: [birth year] ?Admit Type: Outpatient ?Age: 71 ?Room: [facility] ENDO ROOM 3 ?Gender: Female ?Note Status: Finalized ?Instrument Name: Colonoscope [id number] ?Procedure:             Colonoscopy ?Indications:           Surveillance: Personal history of adenomatous polyps  ?                       on last colonoscopy > 5 years ago ?Providers:             Andrey Farmer MD, MD ?Referring MD:          Yvetta Coder. Arnett (Referring MD) ?Medicines:             Monitored Anesthesia Care ?Complications:         No immediate complications. Estimated blood loss:  ?                       Minimal. ?Procedure:             Pre-Anesthesia Assessment: ?                       - Prior to the procedure, a History and Physical was  ?                       performed, and patient medications and allergies were  ?                       reviewed. The patient is competent. The risks and  ?                       benefits of the procedure and the sedation options and  ?                       risks were discussed with the patient. All questions  ?                       were answered and informed consent was obtained.  ?                       Patient identification and proposed procedure were  ?                       verified by the physician, the nurse, the  ?                       anesthesiologist, the anesthetist and the technician  ?                       in the endoscopy suite. Mental Status Examination:  ?                       alert and oriented. Airway Examination: normal  ?                       oropharyngeal airway and neck mobility. Respiratory  ?  Examination: clear to auscultation. CV Examination:  ?                       normal. Prophylactic Antibiotics: The patient does not  ?                       require prophylactic antibiotics. Prior  ?                        Anticoagulants: The patient has taken no previous  ?                       anticoagulant or antiplatelet agents. ASA Grade  ?                       Assessment: II - A patient with mild systemic disease.  ?                       After reviewing the risks and benefits, the patient  ?                       was deemed in satisfactory condition to undergo the  ?                       procedure. The anesthesia plan was to use monitored  ?                       anesthesia care (MAC). Immediately prior to  ?                       administration of medications, the patient was  ?                       re-assessed for adequacy to receive sedatives. The  ?                       heart rate, respiratory rate, oxygen saturations,  ?                       blood pressure, adequacy of pulmonary ventilation, and  ?                       response to care were monitored throughout the  ?                       procedure. The physical status of the patient was  ?                       re-assessed after the procedure. ?                       After obtaining informed consent, the colonoscope was  ?                       passed under direct vision. Throughout the procedure,  ?                       the patient's blood pressure, pulse, and oxygen  ?  saturations were monitored continuously. The  ?                       Colonoscope was introduced through the anus and  ?                       advanced to the the cecum, identified by appendiceal  ?                       orifice and ileocecal valve. The colonoscopy was  ?                       performed without difficulty. The patient tolerated  ?                       the procedure well. The quality of the bowel  ?                       preparation was good. ?Findings: ?     The perianal and digital rectal examinations were normal. ?     A 1 mm polyp was found in the cecum. The polyp was sessile. The polyp  ?     was removed with a jumbo cold forceps. Resection  and retrieval were  ?     complete. Estimated blood loss was minimal. Estimated blood loss was  ?     minimal. ?     A few small-mouthed diverticula were found in the sigmoid colon. ?     Internal hemorrhoids were found during retroflexion. The hemorrhoids  ?     were Grade I (internal hemorrhoids that do not prolapse). ?     The exam was otherwise without abnormality on direct and retroflexion  ?     views. ?Impression:            - One 1 mm polyp in the cecum, removed with a jumbo  ?                       cold forceps. Resected and retrieved. ?                       - Diverticulosis in the sigmoid colon. ?                       - Internal hemorrhoids. ?                       - The examination was otherwise normal on direct and  ?                       retroflexion views. ?Recommendation:        - Discharge patient to home. ?                       - Resume previous diet. ?                       - Continue present medications. ?                       - Await pathology results. ?                       -  Repeat colonoscopy date to be determined after  ?                       pending pathology results are reviewed for  ?                       surveillance. ?                       - Return to referring physician as previously  ?                       scheduled. ?Procedure Code(s):     --- Professional --- ?                       [phone], Colonoscopy, flexible; with biopsy, single or  ?                       multiple ?Diagnosis Code(s):     --- Professional --- ?                       Z86.010, Personal history of colonic polyps ?                       K63.5, Polyp of colon ?                       K64.0, First degree hemorrhoids ?                       K57.30, Diverticulosis of large intestine without  ?                       perforation or abscess without bleeding ?CPT copyright 2019 American Medical Association. All rights reserved. ?The codes documented in this report are preliminary and upon coder review may  ?be  revised to meet current compliance requirements. ?Andrey Farmer MD, MD ?12/19/2021 2:38:08 PM ?Number of Addenda: 0 ?Note Initiated On: 12/19/2021 2:04 PM ?Scope Withdrawal Time: 0 hours 8 minutes 17 seconds  ?Total Procedure Duration: 0 hours 13 minutes 9 seconds  ?Estimated Blood Loss:  Estimated blood loss was minimal. ?     [facility] ?

## 2021-12-19 NOTE — Anesthesia Preprocedure Evaluation (Signed)
Anesthesia Evaluation  ?Patient identified by MRN, date of birth, ID band ?Patient awake ? ? ? ?Reviewed: ?Allergy & Precautions, NPO status , Patient's Chart, lab work & pertinent test results ? ?History of Anesthesia Complications ?Negative for: history of anesthetic complications ? ?Airway ?Mallampati: III ? ?TM Distance: <3 FB ?Neck ROM: full ? ? ? Dental ? ?(+) Chipped ?  ?Pulmonary ?neg shortness of breath, former smoker,  ?  ?Pulmonary exam normal ? ? ? ? ? ? ? Cardiovascular ?Exercise Tolerance: Good ?(-) angina(-) Past MI and (-) DOE negative cardio ROS ?Normal cardiovascular exam ? ? ?  ?Neuro/Psych ?negative neurological ROS ? negative psych ROS  ? GI/Hepatic ?negative GI ROS, Neg liver ROS, neg GERD  ,  ?Endo/Other  ?Hypothyroidism  ? Renal/GU ?negative Renal ROS  ?negative genitourinary ?  ?Musculoskeletal ? ? Abdominal ?  ?Peds ? Hematology ?negative hematology ROS ?(+)   ?Anesthesia Other Findings ?Past Medical History: ?11/15/2013: Dysplastic nevus ?    Comment:  mid back - mild  ?No date: Thyroid disease ? ?Past Surgical History: ?No date: ADENOIDECTOMY ?    Comment:  as a child ?No date: CESAREAN SECTION ?    Comment:  x2  ?No date: COLONOSCOPY ? ?BMI   ? Body Mass Index: 29.76 kg/m?  ?  ? ? Reproductive/Obstetrics ?negative OB ROS ? ?  ? ? ? ? ? ? ? ? ? ? ? ? ? ?  ?  ? ? ? ? ? ? ? ? ?Anesthesia Physical ?Anesthesia Plan ? ?ASA: 2 ? ?Anesthesia Plan: General  ? ?Post-op Pain Management:   ? ?Induction: Intravenous ? ?PONV Risk Score and Plan: Propofol infusion and TIVA ? ?Airway Management Planned: Natural Airway and Nasal Cannula ? ?Additional Equipment:  ? ?Intra-op Plan:  ? ?Post-operative Plan:  ? ?Informed Consent: I have reviewed the patients History and Physical, chart, labs and discussed the procedure including the risks, benefits and alternatives for the proposed anesthesia with the patient or authorized representative who has indicated his/her  understanding and acceptance.  ? ? ? ?Dental Advisory Given ? ?Plan Discussed with: Anesthesiologist, CRNA and Surgeon ? ?Anesthesia Plan Comments: (Patient consented for risks of anesthesia including but not limited to:  ?- adverse reactions to medications ?- risk of airway placement if required ?- damage to eyes, teeth, lips or other oral mucosa ?- nerve damage due to positioning  ?- sore throat or hoarseness ?- Damage to heart, brain, nerves, lungs, other parts of body or loss of life ? ?Patient voiced understanding.)  ? ? ? ? ? ? ?Anesthesia Quick Evaluation ? ?

## 2021-12-19 NOTE — H&P (Signed)
Outpatient short stay form Pre-procedure ?12/19/2021  ?Lesly Rubenstein, MD ? ?Primary Physician: Burnard Hawthorne, FNP ? ?Reason for visit:  Surveillance ? ?History of present illness:   ? ?71 y/o lady with history of hypothyrodisim and HLD here for surveillance colonoscopy. Last colonoscopy was in 2017.with hyperplastic polyp. No abdominal surgeries. No blood thinners. No first degree relatives with colon cancer. ? ? ? ?Current Facility-Administered Medications:  ?  0.9 %  sodium chloride infusion, , Intravenous, Continuous, Shaine Mount, Hilton Cork, MD, Last Rate: 20 mL/hr at 12/19/21 1350, 1,000 mL at 12/19/21 1350 ? ?Medications Prior to Admission  ?Medication Sig Dispense Refill Last Dose  ? levothyroxine (SYNTHROID) 100 MCG tablet TAKE ONE TABLET BY MOUTH EVERY MORNING 30 MINUTES BEFORE BREAKFAST 90 tablet 1 12/19/2021 at 0600  ? pravastatin (PRAVACHOL) 20 MG tablet TAKE ONE TABLET BY MOUTH DAILY 90 tablet 3 12/18/2021  ? ? ? ?No Known Allergies ? ? ?Past Medical History:  ?Diagnosis Date  ? Dysplastic nevus 11/15/2013  ? mid back - mild   ? Thyroid disease   ? ? ?Review of systems:  Otherwise negative.  ? ? ?Physical Exam ? ?Gen: Alert, oriented. Appears stated age.  ?HEENT: PERRLA. ?Lungs: No respiratory distress ?CV: RRR ?Abd: soft, benign, no masses ?Ext: No edema ? ? ? ?Planned procedures: Proceed with colonoscopy. The patient understands the nature of the planned procedure, indications, risks, alternatives and potential complications including but not limited to bleeding, infection, perforation, damage to internal organs and possible oversedation/side effects from anesthesia. The patient agrees and gives consent to proceed.  ?Please refer to procedure notes for findings, recommendations and patient disposition/instructions.  ? ? ? ?Lesly Rubenstein, MD ?[facility] ? ? ? ?  ? ?

## 2021-12-19 NOTE — Transfer of Care (Signed)
Immediate Anesthesia Transfer of Care Note ? ?Patient: Tara Coleman ? ?Procedure(s) Performed: COLONOSCOPY WITH PROPOFOL ? ?Patient Location: PACU ? ?Anesthesia Type:General ? ?Level of Consciousness: awake, alert  and oriented ? ?Airway & Oxygen Therapy: Patient Spontanous Breathing and Patient connected to nasal cannula oxygen ? ?Post-op Assessment: Report given to RN and Post -op Vital signs reviewed and stable ? ?Post vital signs: Reviewed and stable ? ?Last Vitals:  ?Vitals Value Taken Time  ?BP 94/82 12/19/21 1435  ?Temp 36.8 ?C 12/19/21 1435  ?Pulse 84 12/19/21 1436  ?Resp 18 12/19/21 1436  ?SpO2 100 % 12/19/21 1436  ?Vitals shown include unvalidated device data. ? ?Last Pain:  ?Vitals:  ? 12/19/21 1435  ?TempSrc: Temporal  ?PainSc: 0-No pain  ?   ? ?Patients Stated Pain Goal: 0 (12/19/21 1320) ? ?Complications: No notable events documented. ?

## 2021-12-22 ENCOUNTER — Encounter: Payer: Self-pay | Admitting: Gastroenterology

## 2021-12-22 NOTE — Anesthesia Postprocedure Evaluation (Signed)
Anesthesia Post Note ? ?Patient: Tara Coleman ? ?Procedure(s) Performed: COLONOSCOPY WITH PROPOFOL ? ?Patient location during evaluation: PACU ?Anesthesia Type: General ?Level of consciousness: awake and alert ?Pain management: pain level controlled ?Vital Signs Assessment: post-procedure vital signs reviewed and stable ?Respiratory status: spontaneous breathing, nonlabored ventilation and respiratory function stable ?Cardiovascular status: blood pressure returned to baseline and stable ?Postop Assessment: no apparent nausea or vomiting ?Anesthetic complications: no ? ? ?No notable events documented. ? ? ?Last Vitals:  ?Vitals:  ? 12/19/21 1445 12/19/21 1455  ?BP:  (!) 119/59  ?Pulse:  77  ?Resp:    ?Temp:    ?SpO2: 100% 100%  ?  ?Last Pain:  ?Vitals:  ? 12/19/21 1455  ?TempSrc:   ?PainSc: 0-No pain  ? ? ?  ?  ?  ?  ?  ?  ? ?[place] Ouch ? ? ? ? ?

## 2021-12-23 LAB — SURGICAL PATHOLOGY

## 2022-02-12 DIAGNOSIS — J40 Bronchitis, not specified as acute or chronic: Secondary | ICD-10-CM | POA: Diagnosis not present

## 2022-03-02 ENCOUNTER — Ambulatory Visit: Payer: No Typology Code available for payment source | Admitting: Internal Medicine

## 2022-03-02 ENCOUNTER — Telehealth: Payer: Self-pay

## 2022-03-02 NOTE — Telephone Encounter (Signed)
Patient's call was transferred to Access Nurse.

## 2022-03-02 NOTE — Telephone Encounter (Signed)
Patient states she has been sick for about four weeks.  Patient states she has been out of town and during that time, went to a [facility].  Patient states she has had one round of antibiotics and finished a second round of stronger antibiotics 4-5 days ago, and she is still coughing up yellow glob.

## 2022-03-04 ENCOUNTER — Encounter: Payer: Self-pay | Admitting: Family Medicine

## 2022-03-04 ENCOUNTER — Ambulatory Visit (INDEPENDENT_AMBULATORY_CARE_PROVIDER_SITE_OTHER): Payer: No Typology Code available for payment source

## 2022-03-04 ENCOUNTER — Ambulatory Visit (INDEPENDENT_AMBULATORY_CARE_PROVIDER_SITE_OTHER): Payer: No Typology Code available for payment source | Admitting: Family Medicine

## 2022-03-04 DIAGNOSIS — J989 Respiratory disorder, unspecified: Secondary | ICD-10-CM | POA: Diagnosis not present

## 2022-03-04 DIAGNOSIS — R053 Chronic cough: Secondary | ICD-10-CM | POA: Diagnosis not present

## 2022-03-04 NOTE — Assessment & Plan Note (Signed)
Seems as though the patient has generally improved from where she was initially.  She does still have some postnasal drip and a cough which may be residual from her prior infection and could improve over the next week or 2.  She does have some crackles in her right upper lung field and thus I will get a chest x-ray though given her improvement if the x-ray is negative I doubt this would be related to pneumonia.  Discussed use of Flonase and Claritin or Zyrtec to help with any residual inflammatory symptoms and postnasal drip.  We will contact her with her chest x-ray results.  If she has worsening symptoms she will let us know.

## 2022-03-04 NOTE — Patient Instructions (Signed)
Nice to see you. We will get an x-ray today and contact you with the results. If your symptoms worsen please let us know. You can try Flonase and Claritin or Zyrtec over-the-counter to see if that will help with your residual symptoms.

## 2022-03-04 NOTE — Progress Notes (Signed)
Tommi Rumps, MD Phone: [phone]  Tara Coleman is a 71 y.o. female who presents today for same-day visit.  Respiratory infection: Patient notes 4 weeks ago she started to feel sick with losing her voice and sore throat.  Progressively worsened.  She was in [place] and traveled back to the [place] and then took a COVID test which was negative.  She went to urgent care the next day and they placed her on Augmentin and gave her cough medication.  She slept for 4 days and was not improved.  She subsequently went back to urgent care and was given doxycycline and prednisone about 2 weeks ago.  She notes her congestion has improved significantly.  She is no longer wheezing.  She notes continued postnasal drip and some cough trying to hack up mucus from her throat.  She notes no chest congestion.  No fevers.  No shortness of breath.  She notes they did advise her that her ears were infected.  Social History   Tobacco Use  Smoking Status Former   Packs/day: 1.00   Years: 30.00   Total pack years: 30.00   Types: Cigarettes   Quit date: 04/17/2018   Years since quitting: 3.8  Smokeless Tobacco Never    Current Outpatient Medications on File Prior to Visit  Medication Sig Dispense Refill   doxycycline (ADOXA) 100 MG tablet Take 100 mg by mouth 2 (two) times daily.     HYDROcodone-Acetaminophen 10-300 MG/15ML SOLN Take 10 mg by mouth 2 (two) times daily as needed.     levothyroxine (SYNTHROID) 100 MCG tablet TAKE ONE TABLET BY MOUTH EVERY MORNING 30 MINUTES BEFORE BREAKFAST 90 tablet 1   pravastatin (PRAVACHOL) 20 MG tablet TAKE ONE TABLET BY MOUTH DAILY 90 tablet 3   predniSONE (DELTASONE) 20 MG tablet Take 20 mg by mouth daily with breakfast.     No current facility-administered medications on file prior to visit.     ROS see history of present illness  Objective  Physical Exam Vitals:   03/04/22 1323  BP: 90/60  Pulse: 75  Temp: 98.6 F (37 C)  SpO2: 98%    BP Readings  from Last 3 Encounters:  03/04/22 90/60  12/19/21 (!) 119/59  01/02/20 108/60   Wt Readings from Last 3 Encounters:  03/04/22 187 lb 6.4 oz (85 kg)  12/19/21 190 lb (86.2 kg)  04/30/21 180 lb (81.6 kg)    Physical Exam Constitutional:      General: She is not in acute distress.    Appearance: She is not diaphoretic.  Cardiovascular:     Rate and Rhythm: Normal rate and regular rhythm.     Heart sounds: Normal heart sounds.  Pulmonary:     Effort: Pulmonary effort is normal.     Comments: Very minimal crackling in the right upper lung field, otherwise lungs are clear Skin:    General: Skin is warm and dry.  Neurological:     Mental Status: She is alert.      Assessment/Plan: Please see individual problem list.  Problem List Items Addressed This Visit     Respiratory illness    Seems as though the patient has generally improved from where she was initially.  She does still have some postnasal drip and a cough which may be residual from her prior infection and could improve over the next week or 2.  She does have some crackles in her right upper lung field and thus I will get a chest x-ray  though given her improvement if the x-ray is negative I doubt this would be related to pneumonia.  Discussed use of Flonase and Claritin or Zyrtec to help with any residual inflammatory symptoms and postnasal drip.  We will contact her with her chest x-ray results.  If she has worsening symptoms she will let us know.      Relevant Orders   DG Chest 2 View     Return if symptoms worsen or fail to improve.   Tommi Rumps, MD [employer]

## 2022-03-26 ENCOUNTER — Ambulatory Visit (INDEPENDENT_AMBULATORY_CARE_PROVIDER_SITE_OTHER): Payer: No Typology Code available for payment source | Admitting: Dermatology

## 2022-03-26 DIAGNOSIS — Z1283 Encounter for screening for malignant neoplasm of skin: Secondary | ICD-10-CM | POA: Diagnosis not present

## 2022-03-26 DIAGNOSIS — I8393 Asymptomatic varicose veins of bilateral lower extremities: Secondary | ICD-10-CM

## 2022-03-26 DIAGNOSIS — L570 Actinic keratosis: Secondary | ICD-10-CM

## 2022-03-26 DIAGNOSIS — L719 Rosacea, unspecified: Secondary | ICD-10-CM

## 2022-03-26 DIAGNOSIS — L578 Other skin changes due to chronic exposure to nonionizing radiation: Secondary | ICD-10-CM

## 2022-03-26 DIAGNOSIS — D229 Melanocytic nevi, unspecified: Secondary | ICD-10-CM | POA: Diagnosis not present

## 2022-03-26 DIAGNOSIS — L821 Other seborrheic keratosis: Secondary | ICD-10-CM | POA: Diagnosis not present

## 2022-03-26 DIAGNOSIS — L814 Other melanin hyperpigmentation: Secondary | ICD-10-CM | POA: Diagnosis not present

## 2022-03-26 DIAGNOSIS — D1800 Hemangioma unspecified site: Secondary | ICD-10-CM

## 2022-03-26 NOTE — Patient Instructions (Signed)
Due to recent changes in healthcare laws, you may see results of your pathology and/or laboratory studies on MyChart before the doctors have had a chance to review them. We understand that in some cases there may be results that are confusing or concerning to you. Please understand that not all results are received at the same time and often the doctors may need to interpret multiple results in order to provide you with the best plan of care or course of treatment. Therefore, we ask that you please give us 2 business days to thoroughly review all your results before contacting the office for clarification. Should we see a critical lab result, you will be contacted sooner.   If You Need Anything After Your Visit  If you have any questions or concerns for your doctor, please call our main line at [phone] and press option 4 to reach your doctor's medical assistant. If no one answers, please leave a voicemail as directed and we will return your call as soon as possible. Messages left after 4 pm will be answered the following business day.   You may also send us a message via MyChart. We typically respond to MyChart messages within 1-2 business days.  For prescription refills, please ask your pharmacy to contact our office. Our fax number is [phone].  If you have an urgent issue when the clinic is closed that cannot wait until the next business day, you can page your doctor at the number below.    Please note that while we do our best to be available for urgent issues outside of office hours, we are not available 24/7.   If you have an urgent issue and are unable to reach us, you may choose to seek medical care at your doctor's office, retail clinic, urgent care center, or emergency room.  If you have a medical emergency, please immediately call 911 or go to the emergency department.  Pager Numbers  - Dr. Kowalski: [phone]  - Dr. Moye: [phone]  - Dr. Stewart:  [phone]  In the event of inclement weather, please call our main line at [phone] for an update on the status of any delays or closures.  Dermatology Medication Tips: Please keep the boxes that topical medications come in in order to help keep track of the instructions about where and how to use these. Pharmacies typically print the medication instructions only on the boxes and not directly on the medication tubes.   If your medication is too expensive, please contact our office at [phone] option 4 or send us a message through MyChart.   We are unable to tell what your co-pay for medications will be in advance as this is different depending on your insurance coverage. However, we may be able to find a substitute medication at lower cost or fill out paperwork to get insurance to cover a needed medication.   If a prior authorization is required to get your medication covered by your insurance company, please allow us 1-2 business days to complete this process.  Drug prices often vary depending on where the prescription is filled and some pharmacies may offer cheaper prices.  The website www.goodrx.com contains coupons for medications through different pharmacies. The prices here do not account for what the cost may be with help from insurance (it may be cheaper with your insurance), but the website can give you the price if you did not use any insurance.  - You can print the associated coupon and take it with   your prescription to the pharmacy.  - You may also stop by our office during regular business hours and pick up a GoodRx coupon card.  - If you need your prescription sent electronically to a different pharmacy, notify our office through [place] MyChart or by phone at [phone] option 4.     Si Usted Necesita Algo Despus de Su Visita  Tambin puede enviarnos un mensaje a travs de MyChart. Por lo general respondemos a los mensajes de MyChart en el transcurso de 1 a 2  das hbiles.  Para renovar recetas, por favor pida a su farmacia que se ponga en contacto con nuestra oficina. Nuestro nmero de fax es el [phone].  Si tiene un asunto urgente cuando la clnica est cerrada y que no puede esperar hasta el siguiente da hbil, puede llamar/localizar a su doctor(a) al nmero que aparece a continuacin.   Por favor, tenga en cuenta que aunque hacemos todo lo posible para estar disponibles para asuntos urgentes fuera del horario de oficina, no estamos disponibles las 24 horas del da, los 7 das de la semana.   Si tiene un problema urgente y no puede comunicarse con nosotros, puede optar por buscar atencin mdica  en el consultorio de su doctor(a), en una clnica privada, en un centro de atencin urgente o en una sala de emergencias.  Si tiene una emergencia mdica, por favor llame inmediatamente al 911 o vaya a la sala de emergencias.  Nmeros de bper  - Dr. Kowalski: [phone]  - Dra. Moye: [phone]  - Dra. Stewart: [phone]  En caso de inclemencias del tiempo, por favor llame a nuestra lnea principal al [phone] para una actualizacin sobre el estado de cualquier retraso o cierre.  Consejos para la medicacin en dermatologa: Por favor, guarde las cajas en las que vienen los medicamentos de uso tpico para ayudarle a seguir las instrucciones sobre dnde y cmo usarlos. Las farmacias generalmente imprimen las instrucciones del medicamento slo en las cajas y no directamente en los tubos del medicamento.   Si su medicamento es muy caro, por favor, pngase en contacto con nuestra oficina llamando al [phone] y presione la opcin 4 o envenos un mensaje a travs de MyChart.   No podemos decirle cul ser su copago por los medicamentos por adelantado ya que esto es diferente dependiendo de la cobertura de su seguro. Sin embargo, es posible que podamos encontrar un medicamento sustituto a menor costo o llenar un formulario para que el  seguro cubra el medicamento que se considera necesario.   Si se requiere una autorizacin previa para que su compaa de seguros cubra su medicamento, por favor permtanos de 1 a 2 das hbiles para completar este proceso.  Los precios de los medicamentos varan con frecuencia dependiendo del lugar de dnde se surte la receta y alguna farmacias pueden ofrecer precios ms baratos.  El sitio web www.goodrx.com tiene cupones para medicamentos de diferentes farmacias. Los precios aqu no tienen en cuenta lo que podra costar con la ayuda del seguro (puede ser ms barato con su seguro), pero el sitio web puede darle el precio si no utiliz ningn seguro.  - Puede imprimir el cupn correspondiente y llevarlo con su receta a la farmacia.  - Tambin puede pasar por nuestra oficina durante el horario de atencin regular y recoger una tarjeta de cupones de GoodRx.  - Si necesita que su receta se enve electrnicamente a una farmacia diferente, informe a nuestra oficina a travs de MyChart de    o por telfono llamando al [phone] y presione la opcin 4.  

## 2022-03-26 NOTE — Progress Notes (Signed)
Follow-Up Visit   Subjective  Tara Coleman is a 71 y.o. female who presents for the following: Annual Exam (Hx dysplastic nevus and AK's ). The patient presents for Total-Body Skin Exam (TBSE) for skin cancer screening and mole check.  The patient has spots, moles and lesions to be evaluated, some may be new or changing and the patient has concerns that these could be cancer.  The following portions of the chart were reviewed this encounter and updated as appropriate:   Tobacco  Allergies  Meds  Problems  Med Hx  Surg Hx  Fam Hx     Review of Systems:  No other skin or systemic complaints except as noted in HPI or Assessment and Plan.  Objective  Well appearing patient in no apparent distress; mood and affect are within normal limits.  A full examination was performed including scalp, head, eyes, ears, nose, lips, neck, chest, axillae, abdomen, back, buttocks, bilateral upper extremities, bilateral lower extremities, hands, feet, fingers, toes, fingernails, and toenails. All findings within normal limits unless otherwise noted below.  Face Erythema of the cheeks.  L temple x 1 Erythematous thin papules/macules with gritty scale.    Assessment & Plan  Rosacea Face Rosacea is a chronic progressive skin condition usually affecting the face of adults, causing redness and/or acne bumps. It is treatable but not curable. It sometimes affects the eyes (ocular rosacea) as well. It may respond to topical and/or systemic medication and can flare with stress, sun exposure, alcohol, exercise and some foods.  Daily application of broad spectrum spf 30+ sunscreen to face is recommended to reduce flares.  Discussed the treatment option of BBL/laser.  Typically we recommend 1-3 treatment sessions about 5-8 weeks apart for best results.  The patient's condition may require "maintenance treatments" in the future.  The fee for BBL / laser treatments is $350 per treatment session for the  whole face.  A fee can be quoted for other parts of the body. Insurance typically does not pay for BBL/laser treatments and therefore the fee is an out-of-pocket cost.  AK (actinic keratosis) L temple x 1 Destruction of lesion - L temple x 1 Complexity: simple   Destruction method: cryotherapy   Informed consent: discussed and consent obtained   Timeout:  patient name, date of birth, surgical site, and procedure verified Lesion destroyed using liquid nitrogen: Yes   Region frozen until ice ball extended beyond lesion: Yes   Outcome: patient tolerated procedure well with no complications   Post-procedure details: wound care instructions given    Lentigines - Scattered tan macules - Due to sun exposure - Benign-appearing, observe - Recommend daily broad spectrum sunscreen SPF 30+ to sun-exposed areas, reapply every 2 hours as needed. - Call for any changes  Seborrheic Keratoses - Stuck-on, waxy, tan-brown papules and/or plaques  - Benign-appearing - Discussed benign etiology and prognosis. - Observe - Call for any changes  Melanocytic Nevi - Tan-brown and/or pink-flesh-colored symmetric macules and papules - Benign appearing on exam today - Observation - Call clinic for new or changing moles - Recommend daily use of broad spectrum spf 30+ sunscreen to sun-exposed areas.   Hemangiomas - Red papules - Discussed benign nature - Observe - Call for any changes  Actinic Damage - Chronic condition, secondary to cumulative UV/sun exposure - diffuse scaly erythematous macules with underlying dyspigmentation - Recommend daily broad spectrum sunscreen SPF 30+ to sun-exposed areas, reapply every 2 hours as needed.  - Staying in the shade  or wearing long sleeves, sun glasses (UVA+UVB protection) and wide brim hats (4-inch brim around the entire circumference of the hat) are also recommended for sun protection.  - Call for new or changing lesions.  History of Dysplastic Nevus - No  evidence of recurrence today - Recommend regular full body skin exams - Recommend daily broad spectrum sunscreen SPF 30+ to sun-exposed areas, reapply every 2 hours as needed.  - Call if any new or changing lesions are noted between office visits  Varicose Veins/Spider Veins - Dilated blue, purple or red veins at the lower extremities - Reassured - Smaller vessels can be treated by sclerotherapy (a procedure to inject a medicine into the veins to make them disappear) if desired, but the treatment is not covered by insurance. Larger vessels may be covered if symptomatic and we would refer to vascular surgeon if treatment desired.  Skin cancer screening performed today.  Return in about 1 year (around 03/27/2023) for TBSE.  Luther Redo, CMA, am acting as scribe for Sarina Ser, MD . Documentation: I have reviewed the above documentation for accuracy and completeness, and I agree with the above.  Sarina Ser, MD

## 2022-03-31 ENCOUNTER — Encounter: Payer: Self-pay | Admitting: Dermatology

## 2022-05-19 ENCOUNTER — Other Ambulatory Visit: Payer: Self-pay | Admitting: Family

## 2022-05-21 ENCOUNTER — Ambulatory Visit (INDEPENDENT_AMBULATORY_CARE_PROVIDER_SITE_OTHER): Payer: No Typology Code available for payment source

## 2022-05-21 VITALS — Ht 67.0 in | Wt 187.0 lb

## 2022-05-21 DIAGNOSIS — Z0000 Encounter for general adult medical examination without abnormal findings: Secondary | ICD-10-CM

## 2022-05-21 NOTE — Progress Notes (Signed)
Subjective:   Tara Coleman is a 71 y.o. female who presents for Medicare Annual (Subsequent) preventive examination.  Review of Systems    No ROS.  Medicare Wellness Virtual Visit.  Visual/audio telehealth visit, UTA vital signs.   See social history for additional risk factors.   Cardiac Risk Factors include: advanced age (>84mn, >>68women)     Objective:    Today's Vitals   05/21/22 1304  Weight: 187 lb (84.8 kg)  Height: '5\' 7"'$  (1.702 m)   Body mass index is 29.29 kg/m.     05/21/2022    1:05 PM 12/19/2021    1:13 PM 04/30/2021    3:47 PM 03/31/2019    9:33 AM  Advanced Directives  Does Patient Have a Medical Advance Directive? Yes Yes Yes Yes  Type of [place] Out of facility [place] (pink MOST or yellow form) [place] Does patient want to make changes to medical advance directive? No - Patient declined  No - Patient declined No - Patient declined  Copy of [place] Chart? No - copy requested  No - copy requested No - copy requested    Current Medications (verified) Outpatient Encounter Medications as of 05/21/2022  Medication Sig   doxycycline (ADOXA) 100 MG tablet Take 100 mg by mouth 2 (two) times daily.   HYDROcodone-Acetaminophen 10-300 MG/15ML SOLN Take 10 mg by mouth 2 (two) times daily as needed.   levothyroxine (SYNTHROID) 100 MCG tablet TAKE ONE TABLET BY MOUTH EVERY MORNING 30 MINUTES BEFORE BREAKFAST   pravastatin (PRAVACHOL) 20 MG tablet TAKE ONE TABLET BY MOUTH DAILY   predniSONE (DELTASONE) 20 MG tablet Take 20 mg by mouth daily with breakfast.   No facility-administered encounter medications on file as of 05/21/2022.    Allergies (verified) Patient has no known allergies.   History: Past Medical History:  Diagnosis Date   Dysplastic nevus 11/15/2013   mid back - mild    Thyroid disease    Past Surgical  History:  Procedure Laterality Date   ADENOIDECTOMY     as a child   CESAREAN SECTION     x2    COLONOSCOPY     COLONOSCOPY WITH PROPOFOL N/A 12/19/2021   Procedure: COLONOSCOPY WITH PROPOFOL;  Surgeon: LLesly Rubenstein MD;  Location: [facility] ENDOSCOPY;  Service: Endoscopy;  Laterality: N/A;   Family History  Problem Relation Age of Onset   Cancer Mother 637      biliary duct   Thyroid disease Sister    Psoriasis Son    Colon cancer Maternal Grandfather        lived to 827  Breast cancer Neg Hx    Social History   Socioeconomic History   Marital status: Married    Spouse name: Not on file   Number of children: 2   Years of education: Not on file   Highest education level: Not on file  Occupational History   Occupation: Self Employed - Tennis  Tobacco Use   Smoking status: Former    Packs/day: 1.00    Years: 30.00    Total pack years: 30.00    Types: Cigarettes    Quit date: 04/17/2018    Years since quitting: 4.0   Smokeless tobacco: Never  Vaping Use   Vaping Use: Never used  Substance and Sexual Activity   Alcohol use: Yes    Alcohol/week: 0.0 standard  drinks of alcohol    Comment: Glass wine hs   Drug use: No   Sexual activity: Not on file  Other Topics Concern   Not on file  Social History Narrative   Tennis line rep   Two children   Social Determinants of Health   Financial Resource Strain: Low Risk  (05/21/2022)   Overall Financial Resource Strain (CARDIA)    Difficulty of Paying Living Expenses: Not hard at all  Food Insecurity: No Food Insecurity (05/21/2022)   Hunger Vital Sign    Worried About Running Out of Food in the Last Year: Never true    Ran Out of Food in the Last Year: Never true  Transportation Needs: No Transportation Needs (05/21/2022)   PRAPARE - Hydrologist (Medical): No    Lack of Transportation (Non-Medical): No  Physical Activity: Sufficiently Active (05/21/2022)   Exercise Vital Sign    Days of  Exercise per Week: 5 days    Minutes of Exercise per Session: 30 min  Stress: No Stress Concern Present (05/21/2022)   [place]    Feeling of Stress : Not at all  Social Connections: Unknown (05/21/2022)   Social Connection and Isolation Panel [NHANES]    Frequency of Communication with Friends and Family: Not on file    Frequency of Social Gatherings with Friends and Family: Not on file    Attends Religious Services: Not on file    Active Member of Clubs or Organizations: Not on file    Attends Archivist Meetings: Not on file    Marital Status: Married    Tobacco Counseling Counseling given: Not Answered   Clinical Intake:                         Activities of Daily Living    05/21/2022    1:07 PM  In your present state of health, do you have any difficulty performing the following activities:  Hearing? 0  Vision? 0  Difficulty concentrating or making decisions? 0  Walking or climbing stairs? 0  Dressing or bathing? 0  Doing errands, shopping? 0  Preparing Food and eating ? N  Using the Toilet? N  In the past six months, have you accidently leaked urine? N  Do you have problems with loss of bowel control? N  Managing your Medications? N  Managing your Finances? N  Housekeeping or managing your Housekeeping? N    Patient Care Team: Burnard Hawthorne, FNP as PCP - General (Family Medicine)  Indicate any recent Medical Services you may have received from other than [facility] providers in the past year (date may be approximate).     Assessment:   This is a routine wellness examination for Tara Coleman.  I connected with  Tara Coleman on 05/21/22 by a audio enabled telemedicine application and verified that I am speaking with the correct person using two identifiers.  Patient Location: Home  Provider Location: Office/Clinic  I discussed the limitations of evaluation and  management by telemedicine. The patient expressed understanding and agreed to proceed.   Hearing/Vision screen Hearing Screening - Comments:: Patient is able to hear conversational tones without difficulty. No issues reported. Vision Screening - Comments:: Followed by Dr. Marvel Plan Wears corrective lenses  They have seen their ophthalmologist in the last 12 months.   Dietary issues and exercise activities discussed:   Healthy diet   Goals Addressed  This Visit's Progress    Maintain Healthy Lifestyle   On track    Healthy diet Stay active       Depression Screen    05/21/2022    1:04 PM 03/04/2022    1:25 PM 04/30/2021    3:46 PM 01/02/2020    9:42 AM 03/31/2019    9:34 AM 09/05/2018   11:18 AM 09/05/2018    9:55 AM  PHQ 2/9 Scores  PHQ - 2 Score 0 0 0 0 0 0 0  PHQ- 9 Score    2  0 1    Fall Risk    05/21/2022    1:06 PM 03/04/2022    1:25 PM 04/30/2021    3:53 PM 01/02/2020    9:03 AM 03/31/2019    9:34 AM  Fall Risk   Falls in the past year? 0 0 0 0 0  Number falls in past yr: 0 0 0    Injury with Fall?  0 0    Risk for fall due to : No Fall Risks No Fall Risks     Follow up Falls evaluation completed Falls evaluation completed Falls evaluation completed Falls evaluation completed     Goodell: Home free of loose throw rugs in walkways, pet beds, electrical cords, etc? Yes  Adequate lighting in your home to reduce risk of falls? Yes   ASSISTIVE DEVICES UTILIZED TO PREVENT FALLS: Life alert? No  Use of a cane, walker or w/c? No   TIMED UP AND GO: Was the test performed? No .    Cognitive Function:  Patient is alert and oriented x3.  Enjoys brain health stimulating activities like [employer].  100% independent.  Denies difficulty making decisions, memory loss, focusing.       05/21/2022    1:07 PM 03/31/2019    9:35 AM  6CIT Screen  What Year?  0 points  What month?  0 points  What time?  0 points   Count back from 20  0 points  Months in reverse 0 points 0 points  Repeat phrase  0 points  Total Score  0 points    Immunizations Immunization History  Administered Date(s) Administered   Influenza, Quadrivalent, Recombinant, Inj, Pf 05/08/2019   Influenza-Unspecified 05/17/2020   PFIZER(Purple Top)SARS-COV-2 Vaccination 08/21/2019, 09/11/2019, 05/24/2020, 11/19/2020, 04/28/2021   Pneumococcal Conjugate-13 11/10/2017, 05/08/2019   Pneumococcal Polysaccharide-23 09/10/2016   Respiratory Syncytial Virus Vaccine,Recomb Aduvanted(Arexvy) 05/20/2022   Tdap 08/05/2005, 08/27/2016   Zoster Recombinat (Shingrix) 12/15/2020, 02/27/2021   Zoster, Live 07/28/2013   Flu Vaccine status: Due, Education has been provided regarding the importance of this vaccine. Advised may receive this vaccine at local pharmacy or Health Dept. Aware to provide a copy of the vaccination record if obtained from local pharmacy or Health Dept. Verbalized acceptance and understanding.  Pneumococcal vaccine status: Due, Education has been provided regarding the importance of this vaccine. Advised may receive this vaccine at local pharmacy or Health Dept. Aware to provide a copy of the vaccination record if obtained from local pharmacy or Health Dept. Verbalized acceptance and understanding.  Covid-19 vaccine status: Completed vaccines x5.  Screening Tests Health Maintenance  Topic Date Due   COVID-19 Vaccine (6 - Pfizer risk series) 06/06/2022 (Originally 06/23/2021)   Pneumonia Vaccine 63+ Years old (3 - PPSV23 or PCV20) 07/17/2022 (Originally 09/10/2021)   INFLUENZA VACCINE  11/15/2022 (Originally 03/17/2022)   MAMMOGRAM  10/22/2023   TETANUS/TDAP  08/27/2026   COLONOSCOPY (  Pts 45-41yr Insurance coverage will need to be confirmed)  12/20/2026   DEXA SCAN  Completed   Hepatitis C Screening  Completed   Zoster Vaccines- Shingrix  Completed   HPV VACCINES  Aged Out   Health Maintenance There are no preventive care  reminders to display for this patient.  Lung Cancer Screening: (Low Dose CT Chest recommended if Age 69-80years, 30 pack-year currently smoking OR have quit w/in 15years.) does not qualify.   Hepatitis C Screening: Completed 2019  Vision Screening: Recommended annual ophthalmology exams for early detection of glaucoma and other disorders of the eye.  Dental Screening: Recommended annual dental exams for proper oral hygiene  Community Resource Referral / Chronic Care Management: CRR required this visit?  No   CCM required this visit?  No      Plan:     I have personally reviewed and noted the following in the patient's chart:   Medical and social history Use of alcohol, tobacco or illicit drugs  Current medications and supplements including opioid prescriptions. Patient is not currently taking opioid prescriptions. Functional ability and status Nutritional status Physical activity Advanced directives List of other physicians Hospitalizations, surgeries, and ER visits in previous 12 months Vitals Screenings to include cognitive, depression, and falls Referrals and appointments  In addition, I have reviewed and discussed with patient certain preventive protocols, quality metrics, and best practice recommendations. A written personalized care plan for preventive services as well as general preventive health recommendations were provided to patient.     OVarney Biles LPN   [id number]

## 2022-05-21 NOTE — Patient Instructions (Addendum)
Tara Coleman , Thank you for taking time to come for your Medicare Wellness Visit. I appreciate your ongoing commitment to your health goals. Please review the following plan we discussed and let me know if I can assist you in the future.   These are the goals we discussed:  Goals      Maintain Healthy Lifestyle     Healthy diet Stay active        This is a list of the screening recommended for you and due dates:  Health Maintenance  Topic Date Due   COVID-19 Vaccine (6 - Pfizer risk series) 06/06/2022*   Pneumonia Vaccine (3 - PPSV23 or PCV20) 07/17/2022*   Flu Shot  11/15/2022*   Mammogram  10/22/2023   Tetanus Vaccine  08/27/2026   Colon Cancer Screening  12/20/2026   DEXA scan (bone density measurement)  Completed   Hepatitis C Screening: USPSTF Recommendation to screen - Ages 61-79 yo.  Completed   Zoster (Shingles) Vaccine  Completed   HPV Vaccine  Aged Out  *Topic was postponed. The date shown is not the original due date.    Advanced directives: End of life planning; Advance aging; Advanced directives discussed.  Copy of current HCPOA/Living Will requested.    Conditions/risks identified: none new  Next appointment: Follow up in one year for your annual wellness visit    Preventive Care 65 Years and Older, Female Preventive care refers to lifestyle choices and visits with your health care provider that can promote health and wellness. What does preventive care include? A yearly physical exam. This is also called an annual well check. Dental exams once or twice a year. Routine eye exams. Ask your health care provider how often you should have your eyes checked. Personal lifestyle choices, including: Daily care of your teeth and gums. Regular physical activity. Eating a healthy diet. Avoiding tobacco and drug use. Limiting alcohol use. Practicing safe sex. Taking low-dose aspirin every day. Taking vitamin and mineral supplements as recommended by your health care  provider. What happens during an annual well check? The services and screenings done by your health care provider during your annual well check will depend on your age, overall health, lifestyle risk factors, and family history of disease. Counseling  Your health care provider may ask you questions about your: Alcohol use. Tobacco use. Drug use. Emotional well-being. Home and relationship well-being. Sexual activity. Eating habits. History of falls. Memory and ability to understand (cognition). Work and work Statistician. Reproductive health. Screening  You may have the following tests or measurements: Height, weight, and BMI. Blood pressure. Lipid and cholesterol levels. These may be checked every 5 years, or more frequently if you are over 61 years old. Skin check. Lung cancer screening. You may have this screening every year starting at age 58 if you have a 30-pack-year history of smoking and currently smoke or have quit within the past 15 years. Fecal occult blood test (FOBT) of the stool. You may have this test every year starting at age 36. Flexible sigmoidoscopy or colonoscopy. You may have a sigmoidoscopy every 5 years or a colonoscopy every 10 years starting at age 34. Hepatitis C blood test. Hepatitis B blood test. Sexually transmitted disease (STD) testing. Diabetes screening. This is done by checking your blood sugar (glucose) after you have not eaten for a while (fasting). You may have this done every 1-3 years. Bone density scan. This is done to screen for osteoporosis. You may have this done starting at age  65. Mammogram. This may be done every 1-2 years. Talk to your health care provider about how often you should have regular mammograms. Talk with your health care provider about your test results, treatment options, and if necessary, the need for more tests. Vaccines  Your health care provider may recommend certain vaccines, such as: Influenza vaccine. This is  recommended every year. Tetanus, diphtheria, and acellular pertussis (Tdap, Td) vaccine. You may need a Td booster every 10 years. Zoster vaccine. You may need this after age 81. Pneumococcal 13-valent conjugate (PCV13) vaccine. One dose is recommended after age 71. Pneumococcal polysaccharide (PPSV23) vaccine. One dose is recommended after age 36. Talk to your health care provider about which screenings and vaccines you need and how often you need them. This information is not intended to replace advice given to you by your health care provider. Make sure you discuss any questions you have with your health care provider. Document Released: 08/30/2015 Document Revised: 04/22/2016 Document Reviewed: 06/04/2015 Elsevier Interactive Patient Education  2017 [place] Prevention in the Home Falls can cause injuries. They can happen to people of all ages. There are many things you can do to make your home safe and to help prevent falls. What can I do on the outside of my home? Regularly fix the edges of walkways and driveways and fix any cracks. Remove anything that might make you trip as you walk through a door, such as a raised step or threshold. Trim any bushes or trees on the path to your home. Use bright outdoor lighting. Clear any walking paths of anything that might make someone trip, such as rocks or tools. Regularly check to see if handrails are loose or broken. Make sure that both sides of any steps have handrails. Any raised decks and porches should have guardrails on the edges. Have any leaves, snow, or ice cleared regularly. Use sand or salt on walking paths during winter. Clean up any spills in your garage right away. This includes oil or grease spills. What can I do in the bathroom? Use night lights. Install grab bars by the toilet and in the tub and shower. Do not use towel bars as grab bars. Use non-skid mats or decals in the tub or shower. If you need to sit down in  the shower, use a plastic, non-slip stool. Keep the floor dry. Clean up any water that spills on the floor as soon as it happens. Remove soap buildup in the tub or shower regularly. Attach bath mats securely with double-sided non-slip rug tape. Do not have throw rugs and other things on the floor that can make you trip. What can I do in the bedroom? Use night lights. Make sure that you have a light by your bed that is easy to reach. Do not use any sheets or blankets that are too big for your bed. They should not hang down onto the floor. Have a firm chair that has side arms. You can use this for support while you get dressed. Do not have throw rugs and other things on the floor that can make you trip. What can I do in the kitchen? Clean up any spills right away. Avoid walking on wet floors. Keep items that you use a lot in easy-to-reach places. If you need to reach something above you, use a strong step stool that has a grab bar. Keep electrical cords out of the way. Do not use floor polish or wax that makes floors slippery.  If you must use wax, use non-skid floor wax. Do not have throw rugs and other things on the floor that can make you trip. What can I do with my stairs? Do not leave any items on the stairs. Make sure that there are handrails on both sides of the stairs and use them. Fix handrails that are broken or loose. Make sure that handrails are as long as the stairways. Check any carpeting to make sure that it is firmly attached to the stairs. Fix any carpet that is loose or worn. Avoid having throw rugs at the top or bottom of the stairs. If you do have throw rugs, attach them to the floor with carpet tape. Make sure that you have a light switch at the top of the stairs and the bottom of the stairs. If you do not have them, ask someone to add them for you. What else can I do to help prevent falls? Wear shoes that: Do not have high heels. Have rubber bottoms. Are comfortable  and fit you well. Are closed at the toe. Do not wear sandals. If you use a stepladder: Make sure that it is fully opened. Do not climb a closed stepladder. Make sure that both sides of the stepladder are locked into place. Ask someone to hold it for you, if possible. Clearly mark and make sure that you can see: Any grab bars or handrails. First and last steps. Where the edge of each step is. Use tools that help you move around (mobility aids) if they are needed. These include: Canes. Walkers. Scooters. Crutches. Turn on the lights when you go into a dark area. Replace any light bulbs as soon as they burn out. Set up your furniture so you have a clear path. Avoid moving your furniture around. If any of your floors are uneven, fix them. If there are any pets around you, be aware of where they are. Review your medicines with your doctor. Some medicines can make you feel dizzy. This can increase your chance of falling. Ask your doctor what other things that you can do to help prevent falls. This information is not intended to replace advice given to you by your health care provider. Make sure you discuss any questions you have with your health care provider. Document Released: 05/30/2009 Document Revised: 01/09/2016 Document Reviewed: 09/07/2014 Elsevier Interactive Patient Education  2017 [employer].

## 2022-11-12 ENCOUNTER — Other Ambulatory Visit: Payer: Self-pay | Admitting: Family

## 2022-11-12 NOTE — Telephone Encounter (Signed)
Appt schedule for 12/07/22 and refill sent in to pharmacy

## 2022-11-12 NOTE — Telephone Encounter (Signed)
Pt has not been in office since 7/23 but you have not seen her since 2021? Would you like me to refill for 30 days and schedule f/up appt

## 2022-11-23 ENCOUNTER — Other Ambulatory Visit: Payer: Self-pay | Admitting: Family

## 2022-11-23 DIAGNOSIS — Z1231 Encounter for screening mammogram for malignant neoplasm of breast: Secondary | ICD-10-CM

## 2022-12-07 ENCOUNTER — Ambulatory Visit (INDEPENDENT_AMBULATORY_CARE_PROVIDER_SITE_OTHER): Payer: Medicare HMO | Admitting: Family

## 2022-12-07 ENCOUNTER — Encounter: Payer: Self-pay | Admitting: Family

## 2022-12-07 VITALS — BP 110/80 | HR 68 | Temp 98.0°F | Ht 67.0 in | Wt 197.2 lb

## 2022-12-07 DIAGNOSIS — I700 Atherosclerosis of aorta: Secondary | ICD-10-CM | POA: Diagnosis not present

## 2022-12-07 DIAGNOSIS — Z87891 Personal history of nicotine dependence: Secondary | ICD-10-CM | POA: Diagnosis not present

## 2022-12-07 DIAGNOSIS — Z780 Asymptomatic menopausal state: Secondary | ICD-10-CM

## 2022-12-07 DIAGNOSIS — Z8639 Personal history of other endocrine, nutritional and metabolic disease: Secondary | ICD-10-CM

## 2022-12-07 DIAGNOSIS — E039 Hypothyroidism, unspecified: Secondary | ICD-10-CM

## 2022-12-07 LAB — LIPID PANEL
Cholesterol: 178 mg/dL (ref 0–200)
HDL: 65.6 mg/dL (ref >39.00)
LDL Cholesterol: 99 mg/dL (ref 0–99)
NonHDL: 112.15
Total CHOL/HDL Ratio: 3
Triglycerides: 67 mg/dL (ref 0.0–149.0)
VLDL: 13.4 mg/dL (ref 0.0–40.0)

## 2022-12-07 LAB — COMPREHENSIVE METABOLIC PANEL
ALT: 12 U/L (ref 0–35)
AST: 18 U/L (ref 0–37)
Albumin: 4.3 g/dL (ref 3.5–5.2)
Alkaline Phosphatase: 82 U/L (ref 39–117)
BUN: 12 mg/dL (ref 6–23)
CO2: 28 mEq/L (ref 19–32)
Calcium: 9.1 mg/dL (ref 8.4–10.5)
Chloride: 102 mEq/L (ref 96–112)
Creatinine, Ser: 0.76 mg/dL (ref 0.40–1.20)
GFR: 79.47 mL/min (ref >60.00)
Glucose, Bld: 95 mg/dL (ref 70–99)
Potassium: 4.2 mEq/L (ref 3.5–5.1)
Sodium: 138 mEq/L (ref 135–145)
Total Bilirubin: 0.8 mg/dL (ref 0.2–1.2)
Total Protein: 6.7 g/dL (ref 6.0–8.3)

## 2022-12-07 LAB — TSH: TSH: 1.31 u[IU]/mL (ref 0.35–5.50)

## 2022-12-07 LAB — VITAMIN D 25 HYDROXY (VIT D DEFICIENCY, FRACTURES): VITD: 40.17 ng/mL (ref 30.00–100.00)

## 2022-12-07 NOTE — Assessment & Plan Note (Signed)
Chronic, symptomatically stable.  Pending TSH.  Continue Synthroid 100 mcg

## 2022-12-07 NOTE — Progress Notes (Signed)
Assessment & Plan:  Hypothyroidism, unspecified type Assessment & Plan: Chronic, symptomatically stable.  Pending TSH.  Continue Synthroid 100 mcg  Orders: -     Comprehensive metabolic panel -     TSH  Atherosclerosis of aorta Assessment & Plan: Symptomatically stable.  Atherosclerosis seen on previous CT chest 2021.  Reiterated the importance of continued surveillance CT lung cancer screening program.  Referral has been replaced today.  Pending lipid panel.  Continue pravastatin 27m qd.  After lipid panel, CT lung cancer screening, consider changing to more potent statin.  Orders: -     Comprehensive metabolic panel -     Lipid panel  History of vitamin D deficiency -     VITAMIN D 25 Hydroxy (Vit-D Deficiency, Fractures)  Personal history of tobacco use, presenting hazards to health -     Ambulatory Referral for Lung Cancer Scre -     US AORTA DUPLEX LIMITED; Future  Asymptomatic postmenopausal state -     DG Bone Density; Future     Return precautions given.   Risks, benefits, and alternatives of the medications and treatment plan prescribed today were discussed, and patient expressed understanding.   Education regarding symptom management and diagnosis given to patient on AVS either electronically or printed.  Return for Complete Physical Exam.  Rennie Plowman, FNP  Subjective:    Patient ID: Tara Coleman, female    DOB: 29-Aug-1950, 72 y.o.   MRN: [id number]  CC: Tara Coleman is a 72 y.o. female who presents today for follow up.   HPI: Feels well today.  No new complaints.  She occasionally works major Network engineer.  She stays physically active walking her dog daily.  Denies chest pain ,shortness of breath.  She is compliant with levothyroxine 100 mcg daily.   Due for CT lung cancer screen, dexa Mammogram is scheduled Former smoker    Allergies: Patient has no known allergies. Current Outpatient Medications on File Prior to Visit   Medication Sig Dispense Refill   levothyroxine (SYNTHROID) 100 MCG tablet TAKE ONE TABLET BY MOUTH EVERY MORNING 30 MINUTES BEFORE BREAKFAST 30 tablet 1   pravastatin (PRAVACHOL) 20 MG tablet TAKE ONE TABLET BY MOUTH DAILY 90 tablet 3   No current facility-administered medications on file prior to visit.    Review of Systems  Constitutional:  Negative for chills and fever.  Respiratory:  Negative for cough.   Cardiovascular:  Negative for chest pain and palpitations.  Gastrointestinal:  Negative for nausea and vomiting.      Objective:    BP 110/80   Pulse 68   Temp 98 F (36.7 C) (Oral)   Ht  (1.702 m)   Wt 197 lb 3.2 oz (89.4 kg)   SpO2 98%   BMI 30.89 kg/m  BP Readings from Last 3 Encounters:  12/07/22 110/80  03/04/22 90/60  12/19/21 (!) 119/59   Wt Readings from Last 3 Encounters:  12/07/22 197 lb 3.2 oz (89.4 kg)  05/21/22 187 lb (84.8 kg)  03/04/22 187 lb 6.4 oz (85 kg)    Physical Exam Vitals reviewed.  Constitutional:      Appearance: She is well-developed.  Eyes:     Conjunctiva/sclera: Conjunctivae normal.  Cardiovascular:     Rate and Rhythm: Normal rate and regular rhythm.     Pulses: Normal pulses.     Heart sounds: Normal heart sounds.  Pulmonary:     Effort: Pulmonary effort is normal.     Breath  sounds: Normal breath sounds. No wheezing, rhonchi or rales.  Skin:    General: Skin is warm and dry.  Neurological:     Mental Status: She is alert.  Psychiatric:        Speech: Speech normal.        Behavior: Behavior normal.        Thought Content: Thought content normal.

## 2022-12-07 NOTE — Assessment & Plan Note (Signed)
Symptomatically stable.  Atherosclerosis seen on previous CT chest 2021.  Reiterated the importance of continued surveillance CT lung cancer screening program.  Referral has been replaced today.  Pending lipid panel.  Continue pravastatin 91m qd.  After lipid panel, CT lung cancer screening, consider changing to more potent statin.

## 2022-12-07 NOTE — Patient Instructions (Signed)
I placed an order for an ultrasound of your abdomen to screen for abdominal aortic aneurysm.  This is recommended for all former smokers.    Let us know if you dont hear back within a week in regards to an appointment being scheduled.   So that you are aware, if you are [facility] MyChart user , please pay attention to your MyChart messages as you may receive a MyChart message with a phone number to call and schedule this test/appointment own your own from our referral coordinator. This is a new process so I do not want you to miss this message.  If you are not a MyChart user, you will receive a phone call.   You are due for annual lung cancer screening program.    Previously the program had been managed under [facility] hematology/oncology, now it has been moved to [facility] pulmonology .    I have placed a referral to [facility] pulmonology  and their office will reach out to you to schedule your CT of your chest.  They will reach out to you annually going forward.  If you do not hear from their office in the next 1 to 2 weeks, please call [facility] at [phone]  I have ordered a bone density.  Please call [place] to schedule this .  Nice seeing you today!

## 2022-12-11 ENCOUNTER — Ambulatory Visit
Admission: RE | Admit: 2022-12-11 | Discharge: 2022-12-11 | Disposition: A | Discharge status: Home / Self Care | Payer: Medicare HMO | Source: Ambulatory Visit | Attending: Family | Admitting: Family

## 2022-12-11 DIAGNOSIS — Z1231 Encounter for screening mammogram for malignant neoplasm of breast: Secondary | ICD-10-CM | POA: Insufficient documentation

## 2022-12-15 ENCOUNTER — Encounter: Payer: Self-pay | Admitting: Family

## 2022-12-15 ENCOUNTER — Other Ambulatory Visit: Payer: Self-pay | Admitting: Family

## 2022-12-15 DIAGNOSIS — R928 Other abnormal and inconclusive findings on diagnostic imaging of breast: Secondary | ICD-10-CM

## 2022-12-15 DIAGNOSIS — N63 Unspecified lump in unspecified breast: Secondary | ICD-10-CM

## 2022-12-17 ENCOUNTER — Ambulatory Visit
Admission: RE | Admit: 2022-12-17 | Discharge: 2022-12-17 | Disposition: A | Discharge status: Home / Self Care | Payer: Medicare HMO | Source: Ambulatory Visit | Attending: Family | Admitting: Family

## 2022-12-17 DIAGNOSIS — N63 Unspecified lump in unspecified breast: Secondary | ICD-10-CM

## 2022-12-17 DIAGNOSIS — R928 Other abnormal and inconclusive findings on diagnostic imaging of breast: Secondary | ICD-10-CM | POA: Diagnosis present

## 2022-12-20 ENCOUNTER — Other Ambulatory Visit: Payer: Self-pay | Admitting: Family

## 2023-01-15 ENCOUNTER — Other Ambulatory Visit: Payer: Self-pay | Admitting: Family

## 2023-01-15 ENCOUNTER — Encounter: Payer: Self-pay | Admitting: Family

## 2023-01-15 MED ORDER — LEVOTHYROXINE SODIUM 100 MCG PO TABS: ORAL_TABLET | ORAL | 1 refills | Status: DC

## 2023-02-16 ENCOUNTER — Ambulatory Visit
Admission: RE | Admit: 2023-02-16 | Discharge: 2023-02-16 | Disposition: A | Discharge status: Home / Self Care | Payer: Medicare HMO | Source: Ambulatory Visit | Attending: Family | Admitting: Family

## 2023-02-16 DIAGNOSIS — Z780 Asymptomatic menopausal state: Secondary | ICD-10-CM | POA: Diagnosis present

## 2023-03-08 ENCOUNTER — Ambulatory Visit (INDEPENDENT_AMBULATORY_CARE_PROVIDER_SITE_OTHER): Payer: Medicare HMO | Admitting: Internal Medicine

## 2023-03-08 ENCOUNTER — Encounter: Payer: Self-pay | Admitting: Internal Medicine

## 2023-03-08 VITALS — BP 120/68 | HR 85 | Temp 98.5°F | Resp 16 | Ht 67.0 in | Wt 190.6 lb

## 2023-03-08 DIAGNOSIS — R059 Cough, unspecified: Secondary | ICD-10-CM | POA: Diagnosis not present

## 2023-03-08 DIAGNOSIS — J209 Acute bronchitis, unspecified: Secondary | ICD-10-CM | POA: Diagnosis not present

## 2023-03-08 DIAGNOSIS — J440 Chronic obstructive pulmonary disease with acute lower respiratory infection: Secondary | ICD-10-CM | POA: Diagnosis not present

## 2023-03-08 MED ORDER — PREDNISONE 10 MG PO TABS: ORAL_TABLET | ORAL | 0 refills | Status: DC

## 2023-03-08 MED ORDER — ALBUTEROL SULFATE HFA 108 (90 BASE) MCG/ACT IN AERS: 2.0000 | INHALATION_SPRAY | Freq: Four times a day (QID) | RESPIRATORY_TRACT | 11 refills | Status: DC | PRN

## 2023-03-08 MED ORDER — AZITHROMYCIN 250 MG PO TABS: ORAL_TABLET | ORAL | 0 refills | Status: AC

## 2023-03-08 NOTE — Progress Notes (Signed)
Subjective:  Patient ID: Tara Coleman, female    DOB: [birth year]  Age: 72 y.o. MRN: [id number]  CC: The primary encounter diagnosis was Cough in adult. A diagnosis of Acute bronchitis with COPD (HCC) was also pertinent to this visit.   HPI Retina L Bee presents for  Chief Complaint  Patient presents with   Laryngitis   Generalized Body Aches        Cough   72 yr old female with history of tobacco abuse presents with body aches and loss of voice that started yesterday with loss of voice and malaise,  followed by body aches.  Mild cough , some phlegm .  No headache sinus pressure,  or fevers.   Home COVID test negative x 2 . Patient flew home form [place] on Monday prior   Similar symptoms occurred one year ago after a travelling by plan to [place].  Her chest x ray done last year noted coarse interstitial markings. No formal diagnosis of COPD but Gets lung  [place] screening due to cumulative pack years.    Outpatient Medications Prior to Visit  Medication Sig Dispense Refill   levothyroxine (SYNTHROID) 100 MCG tablet TAKE ONE TABLET BY MOUTH EVERY MORNING 30 MINUTES BEFORE BREAKFAST 90 tablet 1   pravastatin (PRAVACHOL) 20 MG tablet TAKE ONE TABLET BY MOUTH DAILY 90 tablet 3   No facility-administered medications prior to visit.    Review of Systems;  Patient denies headache, fevers, unintentional weight loss, skin rash, eye pain, sinus congestion and sinus pain, sore throat, dysphagia,  hemoptysis ,  dyspnea, wheezing, chest pain, palpitations, orthopnea, edema, abdominal pain, nausea, melena, diarrhea, constipation, flank pain, dysuria, hematuria, urinary  Frequency, nocturia, numbness, tingling, seizures,  Focal weakness, Loss of consciousness,  Tremor, insomnia, depression, anxiety, and suicidal ideation.      Objective:  BP 120/68   Pulse 85   Temp 98.5 F (36.9 C)   Resp 16   Ht 5\' 7"  (1.702 m)   Wt 190 lb 9.6 oz (86.5 kg)   SpO2 98%   BMI 29.85 kg/m    BP Readings from Last 3 Encounters:  03/08/23 120/68  12/07/22 110/80  03/04/22 90/60    Wt Readings from Last 3 Encounters:  03/08/23 190 lb 9.6 oz (86.5 kg)  12/07/22 197 lb 3.2 oz (89.4 kg)  05/21/22 187 lb (84.8 kg)    Physical Exam Vitals reviewed.  Constitutional:      General: She is not in acute distress.    Appearance: Normal appearance. She is normal weight. She is not ill-appearing, toxic-appearing or diaphoretic.  HENT:     Head: Normocephalic.     Right Ear: Tympanic membrane normal.     Left Ear: Tympanic membrane normal.     Nose: Nose normal.     Mouth/Throat:     Mouth: Mucous membranes are moist.     Pharynx: No oropharyngeal exudate or posterior oropharyngeal erythema.  Eyes:     General: No scleral icterus.       Right eye: No discharge.        Left eye: No discharge.     Conjunctiva/sclera: Conjunctivae normal.     Pupils: Pupils are equal, round, and reactive to light.  Cardiovascular:     Rate and Rhythm: Normal rate.  Pulmonary:     Effort: Pulmonary effort is normal.     Breath sounds: Examination of the right-upper field reveals wheezing. Wheezing present.     Comments: Rare inspiratory squeaks ,  Musculoskeletal:        General: Normal range of motion.  Skin:    General: Skin is warm and dry.  Neurological:     General: No focal deficit present.     Mental Status: She is alert and oriented to person, place, and time. Mental status is at baseline.  Psychiatric:        Mood and Affect: Mood normal.        Behavior: Behavior normal.        Thought Content: Thought content normal.        Judgment: Judgment normal.    Lab Results  Component Value Date   HGBA1C 5.5 03/04/2020   HGBA1C 5.5 11/12/2017   HGBA1C 5.3 08/27/2016    Lab Results  Component Value Date   CREATININE 0.76 12/07/2022   CREATININE 0.80 03/04/2020   CREATININE 0.78 11/12/2017    Lab Results  Component Value Date   WBC 5.3 03/04/2020   HGB 14.1 03/04/2020    HCT 42.2 03/04/2020   PLT 282.0 03/04/2020   GLUCOSE 95 12/07/2022   CHOL 178 12/07/2022   TRIG 67.0 12/07/2022   HDL 65.60 12/07/2022   LDLDIRECT 129.7 09/28/2013   LDLCALC 99 12/07/2022   ALT 12 12/07/2022   AST 18 12/07/2022   NA 138 12/07/2022   K 4.2 12/07/2022   CL 102 12/07/2022   CREATININE 0.76 12/07/2022   BUN 12 12/07/2022   CO2 28 12/07/2022   TSH 1.31 12/07/2022   HGBA1C 5.5 03/04/2020   MICROALBUR <0.7 10/02/2014    DG Bone Density  Result Date: 02/16/2023 EXAM: DUAL X-RAY ABSORPTIOMETRY (DXA) FOR BONE MINERAL DENSITY IMPRESSION: Dear Dr. Jason Coop, Your patient Tara Coleman completed a FRAX assessment on 02/16/2023 using the Lunar iDXA DXA System (analysis version: 14.10) manufactured by [employer]. The following summarizes the results of our evaluation. PATIENT BIOGRAPHICAL: Name: Tara Coleman Patient ID: [id number] Birth Date: 09-May-1951 Height:    67.0 in. Gender:     Female    Age:        72       Weight:    192.6 lbs. Ethnicity:  White                            Exam Date: 02/16/2023 FRAX* RESULTS:  (version: 3.5) 10-year Probability of Fracture1 Major Osteoporotic Fracture2 Hip Fracture 9.3% 1.2% Population: [place] (Caucasian) Risk Factors: None Based on Femur (Right) Neck BMD 1 -The 10-year probability of fracture may be lower than reported if the patient has received treatment. 2 -Major Osteoporotic Fracture: Clinical Spine, Forearm, Hip or Shoulder *FRAX is a Armed forces logistics/support/administrative officer of the [employer] of [employer] for Metabolic Bone Disease, a World Science writer (WHO) [employer]. ASSESSMENT: The probability of a major osteoporotic fracture is 9.3% within the next ten years. The probability of a hip fracture is 1.2% within the next ten years. . Your patient Tara Coleman completed a BMD test on 02/16/2023 using the [employer] iDXA DXA System (software version: 14.10) manufactured by [employer]. The following  summarizes the results of our evaluation. Technologist: [facility] PATIENT BIOGRAPHICAL: Name: Tara Coleman Patient ID: [id number] Birth Date: 02/10/1951 Height: 67.0 in. Gender: Female Exam Date: 02/16/2023 Weight: 192.6 lbs. Indications: Advanced Age, Caucasian, Postmenopausal Fractures: Treatments: Vitamin D DENSITOMETRY RESULTS: Site      Region      Measured Date Measured Age WHO Classification Young Adult T-score BMD         %  Change vs. Previous Significant Change (*) AP Spine L1-L4 02/16/2023 70.4 Osteopenia -1.8 0.977 g/cm2 -4.1% Yes AP Spine L1-L4 01/04/2018 65.3 Osteopenia -1.4 1.019 g/cm2 -4.9% Yes AP Spine L1-L4 04/30/2010 57.6 Normal -1.0 1.072 g/cm2 - - DualFemur Total Right 02/16/2023 70.4 Osteopenia -1.3 0.844 g/cm2 -4.2% Yes DualFemur Total Right 01/04/2018 65.3 Normal -1.0 0.881 g/cm2 -2.8% Yes DualFemur Total Right 04/30/2010 57.6 Normal -0.8 0.906 g/cm2 - - DualFemur Total Mean 02/16/2023 70.4 Osteopenia -1.3 0.845 g/cm2 -3.5% Yes DualFemur Total Mean 01/04/2018 65.3 Normal -1.0 0.876 g/cm2 -4.2% Yes DualFemur Total Mean 04/30/2010 57.6 Normal -0.7 0.914 g/cm2 - - ASSESSMENT: The BMD measured at AP Spine L1-L4 is 0.977 g/cm2 with a T-score of -1.8. This patient's diagnostic category is LOW BONE MASS/OSTEOPENIA according to World Health Organization [facility]) criteria. The scan quality is good. Compared with prior study, there has been significant decrease in the spine. Compared with prior study, there has been significant decrease in the total hip. World Science writer [facility]) criteria for post-menopausal, Caucasian Women: Normal:                   T-score at or above -1 SD Osteopenia/low bone mass: T-score between -1 and -2.5 SD Osteoporosis:             T-score at or below -2.5 SD RECOMMENDATIONS: 1. All patients should optimize calcium and vitamin D intake. 2. Consider FDA-approved medical therapies in postmenopausal women and men aged 70 years and older, based on the following: a. A hip or  vertebral(clinical or morphometric) fracture b. T-score < -2.5 at the femoral neck or spine after appropriate evaluation to exclude secondary causes c. Low bone mass (T-score between -1.0 and -2.5 at the femoral neck or spine) and a 10-year probability of a hip fracture > 3% or a 10-year probability of a major osteoporosis-related fracture > 20% based on the [place]-adapted WHO algorithm 3. Clinician judgment and/or patient preferences may indicate treatment for people with 10-year fracture probabilities above or below these levels FOLLOW-UP: People with diagnosed cases of osteoporosis or at high risk for fracture should have regular bone mineral density tests. For patients eligible for Medicare, routine testing is allowed once every 2 years. The testing frequency can be increased to one year for patients who have rapidly progressing disease, those who are receiving or discontinuing medical therapy to restore bone mass, or have additional risk factors. I have reviewed this report, and agree with the above findings. [facility] Electronically Signed   By: Harmon Pier M.D.   On: 02/16/2023 14:42    Assessment & Plan:  .Cough in adult -     DG Chest 2 View; Future  Acute bronchitis with COPD (HCC) Assessment & Plan: AZITHROMYCIN, PREDNISONE TAPER AND ALBUTEROL MDI PRESCRIBED    Other orders -     predniSONE; 6 tablets on Day 1 , then reduce by 1 tablet daily until gone  Dispense: 21 tablet; Refill: 0 -     Albuterol Sulfate HFA; Inhale 2 puffs into the lungs every 6 (six) hours as needed for wheezing or shortness of breath.  Dispense: 3.7 g; Refill: 11 -     Azithromycin; Take 2 tablets on day 1, then 1 tablet daily on days 2 through 5  Dispense: 6 tablet; Refill: 0     Follow-up: No follow-ups on file.   Sherlene Shams, MD

## 2023-03-08 NOTE — Assessment & Plan Note (Signed)
AZITHROMYCIN, PREDNISONE TAPER AND ALBUTEROL MDI PRESCRIBED

## 2023-03-08 NOTE — Patient Instructions (Signed)
I am prescribing you a prednisone taper, and a  5 day course of azithromycin,   I have also added an  albuterol inhaler to use only if needed for wheezing/chest tightness (use every 6 hours if needed).    Use the old robitussin or Delsym OTC for daytime cough  If you start coughing up phlegm that is brown/grown , or develop fever (T > 100.4) while on the antibiotics, please call to schedule the chest x ray I have ordered   Taking an antibiotic can create an imbalance in the normal population of bacteria that live in the small intestine.  This imbalance can persist for 3 months.   Taking a probiotic ( Align, Floraque or Culturelle), the generic version of one of these over the counter medications, or an alternative form (kombucha,  Yogurt, or another dietary source) for a minimum of 3 weeks may help prevent a serious antibiotic associated diarrhea  Called clostridium dificile colitis that occurs when the bacteria population is altered .  Taking a probiotic may also prevent vaginitis due to yeast infections and can be continued indefinitely if you feel that it improves your digestion or your elimination (bowels).

## 2023-04-06 ENCOUNTER — Encounter: Payer: Self-pay | Admitting: *Deleted

## 2023-05-06 ENCOUNTER — Ambulatory Visit: Payer: No Typology Code available for payment source | Admitting: Dermatology

## 2023-05-19 ENCOUNTER — Ambulatory Visit: Payer: Medicare HMO | Admitting: Dermatology

## 2023-05-19 VITALS — BP 122/64 | HR 69

## 2023-05-19 DIAGNOSIS — Z1283 Encounter for screening for malignant neoplasm of skin: Secondary | ICD-10-CM | POA: Diagnosis not present

## 2023-05-19 DIAGNOSIS — Z872 Personal history of diseases of the skin and subcutaneous tissue: Secondary | ICD-10-CM

## 2023-05-19 DIAGNOSIS — Z86018 Personal history of other benign neoplasm: Secondary | ICD-10-CM

## 2023-05-19 DIAGNOSIS — W908XXA Exposure to other nonionizing radiation, initial encounter: Secondary | ICD-10-CM

## 2023-05-19 DIAGNOSIS — L578 Other skin changes due to chronic exposure to nonionizing radiation: Secondary | ICD-10-CM

## 2023-05-19 DIAGNOSIS — L821 Other seborrheic keratosis: Secondary | ICD-10-CM

## 2023-05-19 DIAGNOSIS — D229 Melanocytic nevi, unspecified: Secondary | ICD-10-CM

## 2023-05-19 DIAGNOSIS — L814 Other melanin hyperpigmentation: Secondary | ICD-10-CM | POA: Diagnosis not present

## 2023-05-19 DIAGNOSIS — D1801 Hemangioma of skin and subcutaneous tissue: Secondary | ICD-10-CM

## 2023-05-19 NOTE — Progress Notes (Unsigned)
   Follow-Up Visit   Subjective  Tara Coleman is a 72 y.o. female who presents for the following: Skin Cancer Screening and Full Body Skin Exam The patient presents for Total-Body Skin Exam (TBSE) for skin cancer screening and mole check. The patient has spots, moles and lesions to be evaluated, some may be new or changing and the patient may have concern these could be cancer.  The following portions of the chart were reviewed this encounter and updated as appropriate: medications, allergies, medical history  Review of Systems:  No other skin or systemic complaints except as noted in HPI or Assessment and Plan.  Objective  Well appearing patient in no apparent distress; mood and affect are within normal limits.  A full examination was performed including scalp, head, eyes, ears, nose, lips, neck, chest, axillae, abdomen, back, buttocks, bilateral upper extremities, bilateral lower extremities, hands, feet, fingers, toes, fingernails, and toenails. All findings within normal limits unless otherwise noted below.   Relevant physical exam findings are noted in the Assessment and Plan.   Assessment & Plan   SKIN CANCER SCREENING PERFORMED TODAY.  ACTINIC DAMAGE - Chronic condition, secondary to cumulative UV/sun exposure - diffuse scaly erythematous macules with underlying dyspigmentation - Recommend daily broad spectrum sunscreen SPF 30+ to sun-exposed areas, reapply every 2 hours as needed.  - Staying in the shade or wearing long sleeves, sun glasses (UVA+UVB protection) and wide brim hats (4-inch brim around the entire circumference of the hat) are also recommended for sun protection.  - Call for new or changing lesions.  LENTIGINES, SEBORRHEIC KERATOSES, HEMANGIOMAS - Benign normal skin lesions - Benign-appearing - Call for any changes  MELANOCYTIC NEVI - Tan-brown and/or pink-flesh-colored symmetric macules and papules - Benign appearing on exam today - Observation -  Call clinic for new or changing moles - Recommend daily use of broad spectrum spf 30+ sunscreen to sun-exposed areas.   HISTORY OF DYSPLASTIC NEVUS No evidence of recurrence today Recommend regular full body skin exams Recommend daily broad spectrum sunscreen SPF 30+ to sun-exposed areas, reapply every 2 hours as needed.  Call if any new or changing lesions are noted between office visits  HISTORY OF PRECANCEROUS ACTINIC KERATOSIS - site(s) of PreCancerous Actinic Keratosis clear today. - these may recur and new lesions may form requiring treatment to prevent transformation into skin cancer - observe for new or changing spots and contact [facility] for appointment if occur - photoprotection with sun protective clothing; sunglasses and broad spectrum sunscreen with SPF of at least 30 + and frequent self skin exams recommended - yearly exams by a dermatologist recommended for persons with history of PreCancerous Actinic Keratoses  Return in about 1 year (around 05/18/2024) for TBSE.  Maylene Roes, CMA, am acting as scribe for Armida Sans, MD .   Documentation: I have reviewed the above documentation for accuracy and completeness, and I agree with the above.  Armida Sans, MD

## 2023-05-19 NOTE — Patient Instructions (Signed)

## 2023-05-20 ENCOUNTER — Encounter: Payer: Self-pay | Admitting: Dermatology

## 2023-05-24 ENCOUNTER — Telehealth: Payer: Self-pay | Admitting: Family

## 2023-05-24 NOTE — Telephone Encounter (Signed)
Copied from CRM [id number]. Topic: Medicare AWV >> May 24, 2023 10:17 AM Payton Doughty wrote: Reason for CRM: Called LVM 05/24/2023 to schedule AWV   Verlee Rossetti; Care Guide Ambulatory Clinical Support [place] l [facility] Direct Dial: [phone]

## 2023-06-15 ENCOUNTER — Telehealth: Payer: Self-pay | Admitting: Family

## 2023-06-15 NOTE — Telephone Encounter (Signed)
Copied from CRM [id number]. Topic: Medicare AWV >> Jun 15, 2023  9:45 AM Payton Doughty wrote: Reason for BJY:NWGNFA LVM 06/15/2023 to schedule Annual Wellness Visit  Verlee Rossetti; Care Guide Ambulatory Clinical Support [place] [facility] Direct Dial: [phone]

## 2023-07-06 ENCOUNTER — Other Ambulatory Visit: Payer: Self-pay | Admitting: Family

## 2023-10-18 ENCOUNTER — Telehealth: Payer: Self-pay | Admitting: Family

## 2023-10-18 NOTE — Telephone Encounter (Signed)
 Copied from CRM [id number]. Topic: Medicare AWV >> Oct 18, 2023  2:07 PM Payton Doughty wrote: Reason for CRM: Called LVM 10/18/2023 to schedule AWV. Please schedule office or virtual visits.  Verlee Rossetti; Care Guide Ambulatory Clinical Support [place] [facility] Direct Dial: [phone]

## 2023-11-10 ENCOUNTER — Other Ambulatory Visit: Payer: Self-pay | Admitting: Family

## 2023-11-10 DIAGNOSIS — Z1231 Encounter for screening mammogram for malignant neoplasm of breast: Secondary | ICD-10-CM

## 2023-12-15 ENCOUNTER — Other Ambulatory Visit: Payer: Self-pay | Admitting: Family

## 2023-12-28 ENCOUNTER — Ambulatory Visit
Admission: RE | Admit: 2023-12-28 | Discharge: 2023-12-28 | Disposition: A | Discharge status: Home / Self Care | Source: Ambulatory Visit | Attending: Family | Admitting: Family

## 2023-12-28 DIAGNOSIS — Z1231 Encounter for screening mammogram for malignant neoplasm of breast: Secondary | ICD-10-CM | POA: Insufficient documentation

## 2023-12-29 ENCOUNTER — Other Ambulatory Visit: Payer: Self-pay | Admitting: Family

## 2024-01-18 ENCOUNTER — Other Ambulatory Visit: Payer: Self-pay | Admitting: Medical Genetics

## 2024-01-18 ENCOUNTER — Other Ambulatory Visit: Payer: Self-pay | Admitting: Family

## 2024-01-25 ENCOUNTER — Ambulatory Visit

## 2024-01-27 ENCOUNTER — Encounter: Payer: Self-pay | Admitting: Family

## 2024-01-27 ENCOUNTER — Ambulatory Visit (INDEPENDENT_AMBULATORY_CARE_PROVIDER_SITE_OTHER): Admitting: Family

## 2024-01-27 ENCOUNTER — Telehealth: Payer: Self-pay | Admitting: Family

## 2024-01-27 VITALS — BP 124/70 | HR 63 | Temp 97.7°F | Ht 67.0 in | Wt 198.4 lb

## 2024-01-27 DIAGNOSIS — M79671 Pain in right foot: Secondary | ICD-10-CM | POA: Diagnosis not present

## 2024-01-27 DIAGNOSIS — S61219A Laceration without foreign body of unspecified finger without damage to nail, initial encounter: Secondary | ICD-10-CM | POA: Diagnosis not present

## 2024-01-27 DIAGNOSIS — Z23 Encounter for immunization: Secondary | ICD-10-CM

## 2024-01-27 DIAGNOSIS — Z0000 Encounter for general adult medical examination without abnormal findings: Secondary | ICD-10-CM | POA: Diagnosis not present

## 2024-01-27 DIAGNOSIS — I700 Atherosclerosis of aorta: Secondary | ICD-10-CM

## 2024-01-27 MED ORDER — LEVOTHYROXINE SODIUM 100 MCG PO TABS: ORAL_TABLET | ORAL | 3 refills | Status: DC

## 2024-01-27 NOTE — Progress Notes (Signed)
 Assessment & Plan:  Routine general medical examination at a health care facility Assessment & Plan: Deferred clinical breast exam due to patient preference.  Deferred pelvic exam due to patient preference and she is no longer screening for cervical cancer.  Discussed last Pap smear approximately age of 71.  Discussed guidance to screen until 71 years of age.  She declines Pap smear today.  Encouraged continued exercise.  We discussed AAA screening due to history of smoking.  She will let me know if insurance coverage and she would like to pursue.  Due to recent finger laceration, provided Tdap. Given PCV20.  Orders: -     VITAMIN D  25 Hydroxy (Vit-D Deficiency, Fractures) -     Hemoglobin A1c -     CBC with Differential/Platelet -     Comprehensive metabolic panel with GFR -     Lipid panel -     TSH -     Levothyroxine  Sodium; TAKE 1 TABLET BY MOUTH EVERY MORNING 30 MINUTES BEFORE BREAKFAST  Dispense: 90 tablet; Refill: 3 -     Homocysteine -     Methylmalonic acid, serum -     B12 and Folate Panel  Need for Tdap vaccination -     Tdap vaccine greater than or equal to 7yo IM  Need for pneumococcal 20-valent conjugate vaccination -     Pneumococcal conjugate vaccine 20-valent  Right foot pain Assessment & Plan: Benign exam.  Etiology unclear.  Discussed  neuropathy.  Pending B12.  Podiatry consult if persists   Atherosclerosis of aorta [facility]) Assessment & Plan: I encouraged to have baseline CT calcium score to further stratify cardiovascular risk.  Provided information regarding this test.  She will let me know if she would like for me to order      Return precautions given.   Risks, benefits, and alternatives of the medications and treatment plan prescribed today were discussed, and patient expressed understanding.   Education regarding symptom management and diagnosis given to patient on AVS either electronically or printed.  No follow-ups on file.  Bascom Bossier,  FNP  Subjective:    Patient ID: Martia L Baughman, female    DOB: 1950/09/12, 73 y.o.   MRN: [id number]  CC: Ariadna L Whitmyer is a 73 y.o. female who presents today for physical exam.    HPI: Complains of right 2nd toe pain for months, waxes and wanes.  Episodic pain and occurs when barefoot She has questioned if neuropathy   She doesn't feel like she walking a pebble.   No injury, swelling, plantar foot pain.   She has small left hand cut last week from working on windows. Cleaned and removed splinter.   She is also been reading about homocystine and elevated cardiovascular risk.  She would like her homocystine checked today.  She follows with dermatology for annual skin exam Colorectal Cancer Screening: UTD , Dr Emerick Hanlon, she thinks repeat in 5 years ( however unsure)  Breast Cancer Screening: Mammogram UTD Cervical Cancer Screening: 05/02/2010 ( age 68) , NIL Bone Health screening/DEXA for 65+: UTD, 02/2023, osteopenia   Lung Cancer Screening: due; last 2021  She has no family h/o AAA .        Tetanus - UTD        Pneumococcal - Candidate for PCV20 , 5 years after Pneumococcal Polysaccharide-23    Exercise: Gets regular exercise walking daily Alcohol use:  glass of wine at night Smoking/tobacco use: former smoker, quit 2019.  Health Maintenance  Topic Date Due   Screening for Lung Cancer  04/04/2021   Medicare Annual Wellness Visit  05/22/2023   COVID-19 Vaccine (8 - Pfizer risk 2024-25 season) 10/25/2023   Flu Shot  03/17/2024   Mammogram  12/27/2025   Colon Cancer Screening  12/20/2026   DTaP/Tdap/Td vaccine (4 - Td or Tdap) 01/26/2034   Pneumococcal Vaccine for age over 36  Completed   DEXA scan (bone density measurement)  Completed   Hepatitis C Screening  Completed   Zoster (Shingles) Vaccine  Completed   HPV Vaccine  Aged Out   Meningitis B Vaccine  Aged Out    ALLERGIES: Patient has no known allergies.  Current Outpatient Medications on File  Prior to Visit  Medication Sig Dispense Refill   pravastatin  (PRAVACHOL ) 20 MG tablet TAKE 1 TABLET BY MOUTH DAILY 90 tablet 0   No current facility-administered medications on file prior to visit.    Review of Systems  Constitutional:  Negative for chills, fever and unexpected weight change.  HENT:  Negative for congestion.   Respiratory:  Negative for cough.   Cardiovascular:  Negative for chest pain, palpitations and leg swelling.  Gastrointestinal:  Negative for nausea and vomiting.  Genitourinary:  Negative for difficulty urinating and pelvic pain.  Musculoskeletal:  Positive for arthralgias (right foot). Negative for myalgias.  Skin:  Negative for rash.  Neurological:  Negative for headaches.  Hematological:  Negative for adenopathy.  Psychiatric/Behavioral:  Negative for confusion.       Objective:    BP 124/70   Pulse 63   Temp 97.7 F (36.5 C) (Oral)   Ht 5' 7 (1.702 m)   Wt 198 lb 6.4 oz (90 kg)   SpO2 96%   BMI 31.07 kg/m   BP Readings from Last 3 Encounters:  01/27/24 124/70  05/19/23 122/64  03/08/23 120/68   Wt Readings from Last 3 Encounters:  01/27/24 198 lb 6.4 oz (90 kg)  03/08/23 190 lb 9.6 oz (86.5 kg)  12/07/22 197 lb 3.2 oz (89.4 kg)    Physical Exam Vitals reviewed.  Constitutional:      Appearance: She is well-developed.   Eyes:     Conjunctiva/sclera: Conjunctivae normal.   Neck:     Thyroid : No thyroid  mass or thyromegaly.   Cardiovascular:     Rate and Rhythm: Normal rate and regular rhythm.     Pulses: Normal pulses.     Heart sounds: Normal heart sounds.  Pulmonary:     Effort: Pulmonary effort is normal.     Breath sounds: Normal breath sounds. No wheezing, rhonchi or rales.   Musculoskeletal:     Right foot: Normal range of motion. No swelling, deformity, tenderness or bony tenderness.     Left foot: No swelling or deformity.     Comments: No right foot pain with dorsiflexion.  Lymphadenopathy:     Head:     Right  side of head: No submental, submandibular, tonsillar, preauricular, posterior auricular or occipital adenopathy.     Left side of head: No submental, submandibular, tonsillar, preauricular, posterior auricular or occipital adenopathy.     Cervical: No cervical adenopathy.   Skin:    General: Skin is warm and dry.   Neurological:     Mental Status: She is alert.   Psychiatric:        Speech: Speech normal.        Behavior: Behavior normal.        Thought Content: Thought  content normal.

## 2024-01-27 NOTE — Telephone Encounter (Signed)
 Call dr locklear's office re colonoscopy We need to confirm when due

## 2024-01-27 NOTE — Telephone Encounter (Signed)
 Disregard previous note it was created in error! Aaron AasAaron AasAaron AasPer Dr Emerick Hanlon no further repeat is necessary

## 2024-01-27 NOTE — Telephone Encounter (Addendum)
 Called Dr Emerick Hanlon office, they stated that they would fax over colonoscopy from 2023

## 2024-01-27 NOTE — Patient Instructions (Signed)
 Call insurance and inquire if they will cover for ultrasound of abdomen to evaluate for abdominal aortic aneurysm this is called the AAA screen  We discussed a CT calcium score ( SELF PAY option)  to further stratify your overall cardiovascular risk .   An estimate of cost is $200 out-of-pocket as not covered by insurance. However please call your insurance company in advance to ensure no other costs so that you do not have any unexpected bills.     I have placed your order to [facility] in [place] as  generally most convenient.   Phone Number to [facility] on [address] is is [phone] to get scheduled.   Please call to get scheduled and if any issues at all in doing so, please let me know.   Below an article from [facility] regarding the test.   [email or web address].[email or web address]/exams-and-procedures/screenings/cardiac-ct#:~:text=A%20cardiac%20CT%20calcium%20score,arteries%20can%20cause%20heart%20attacks.   Exams We Offer: Cardiac CT Calcium Score  Knowing your score could save your life. A cardiac CT calcium score, also known as a coronary calcium scan, is a quick, convenient and noninvasive way of evaluating the amount of calcified (hard) plaque in your heart vessels. The level of calcium equates to the extent of plaque build-up in your arteries. Plaque in the arteries can cause heart attacks.  The radiologist reads the images and sends your doctor a report with a calcium score. Patients with higher scores have a greater risk for a heart attack, heart disease or stroke. Knowing your score can help your doctor decide on blood pressure and cholesterol goals that will minimize your risk as much as possible.  The [employer] of Cardiology found that Coronary artery calcification (CAC) is an excellent cardiovascular disease risk marker and can help guide the decision to use cholesterol reducing medications such as statins. A negative calcium score may  reduce the need for statins in otherwise eligible patients.  The exam takes less than 10 minutes, is painless and does not require any IV or oral contrast. At [facility] locations, the out-of-pocket fee without insurance is $75. At the time of scheduling, please let us  know if you want to process the exam through your insurance or self-pay at the rate of $75. Patients who want to self-pay should not submit their insurance card when checking in for the appointment.   Who should get a Cardiac CT Calcium Score: Middle age adults at intermediate risk of heart disease Family history of heart disease Borderline high cholesterol, high blood pressure or diabetes Overweight or physical inactivity Uncertain about taking daily preventive medical therapy  Health Maintenance for Postmenopausal Women Menopause is a normal process in which your ability to get pregnant comes to an end. This process happens slowly over many months or years, usually between the ages of 85 and 17. Menopause is complete when you have missed your menstrual period for 12 months. It is important to talk with your health care provider about some of the most common conditions that affect women after menopause (postmenopausal women). These include heart disease, cancer, and bone loss (osteoporosis). Adopting a healthy lifestyle and getting preventive care can help to promote your health and wellness. The actions you take can also lower your chances of developing some of these common conditions. What are the signs and symptoms of menopause? During menopause, you may have the following symptoms: Hot flashes. These can be moderate or severe. Night sweats. Decrease in sex drive. Mood swings. Headaches. Tiredness (fatigue). Irritability. Memory problems. Problems falling  asleep or staying asleep. Talk with your health care provider about treatment options for your symptoms. Do I need hormone replacement  therapy? Hormone replacement therapy is effective in treating symptoms that are caused by menopause, such as hot flashes and night sweats. Hormone replacement carries certain risks, especially as you become older. If you are thinking about using estrogen or estrogen with progestin, discuss the benefits and risks with your health care provider. How can I reduce my risk for heart disease and stroke? The risk of heart disease, heart attack, and stroke increases as you age. One of the causes may be a change in the body's hormones during menopause. This can affect how your body uses dietary fats, triglycerides, and cholesterol. Heart attack and stroke are medical emergencies. There are many things that you can do to help prevent heart disease and stroke. Watch your blood pressure High blood pressure causes heart disease and increases the risk of stroke. This is more likely to develop in people who have high blood pressure readings or are overweight. Have your blood pressure checked: Every 3-5 years if you are 80-85 years of age. Every year if you are 90 years old or older. Eat a healthy diet  Eat a diet that includes plenty of vegetables, fruits, low-fat dairy products, and lean protein. Do not eat a lot of foods that are high in solid fats, added sugars, or sodium. Get regular exercise Get regular exercise. This is one of the most important things you can do for your health. Most adults should: Try to exercise for at least 150 minutes each week. The exercise should increase your heart rate and make you sweat (moderate-intensity exercise). Try to do strengthening exercises at least twice each week. Do these in addition to the moderate-intensity exercise. Spend less time sitting. Even light physical activity can be beneficial. Other tips Work with your health care provider to achieve or maintain a healthy weight. Do not use any products that contain nicotine or tobacco. These products include  cigarettes, chewing tobacco, and vaping devices, such as e-cigarettes. If you need help quitting, ask your health care provider. Know your numbers. Ask your health care provider to check your cholesterol and your blood sugar (glucose). Continue to have your blood tested as directed by your health care provider. Do I need screening for cancer? Depending on your health history and family history, you may need to have cancer screenings at different stages of your life. This may include screening for: Breast cancer. Cervical cancer. Lung cancer. Colorectal cancer. What is my risk for osteoporosis? After menopause, you may be at increased risk for osteoporosis. Osteoporosis is a condition in which bone destruction happens more quickly than new bone creation. To help prevent osteoporosis or the bone fractures that can happen because of osteoporosis, you may take the following actions: If you are 68-50 years old, get at least 1,000 mg of calcium and at least 600 international units (IU) of vitamin D  per day. If you are older than age 33 but younger than age 23, get at least 1,200 mg of calcium and at least 600 international units (IU) of vitamin D  per day. If you are older than age 21, get at least 1,200 mg of calcium and at least 800 international units (IU) of vitamin D  per day. Smoking and drinking excessive alcohol increase the risk of osteoporosis. Eat foods that are rich in calcium and vitamin D , and do weight-bearing exercises several times each week as directed by your health  care provider. How does menopause affect my mental health? Depression may occur at any age, but it is more common as you become older. Common symptoms of depression include: Feeling depressed. Changes in sleep patterns. Changes in appetite or eating patterns. Feeling an overall lack of motivation or enjoyment of activities that you previously enjoyed. Frequent crying spells. Talk with your health care provider if you think  that you are experiencing any of these symptoms. General instructions See your health care provider for regular wellness exams and vaccines. This may include: Scheduling regular health, dental, and eye exams. Getting and maintaining your vaccines. These include: Influenza vaccine. Get this vaccine each year before the flu season begins. Pneumonia vaccine. Shingles vaccine. Tetanus, diphtheria, and pertussis (Tdap) booster vaccine. Your health care provider may also recommend other immunizations. Tell your health care provider if you have ever been abused or do not feel safe at home. Summary Menopause is a normal process in which your ability to get pregnant comes to an end. This condition causes hot flashes, night sweats, decreased interest in sex, mood swings, headaches, or lack of sleep. Treatment for this condition may include hormone replacement therapy. Take actions to keep yourself healthy, including exercising regularly, eating a healthy diet, watching your weight, and checking your blood pressure and blood sugar levels. Get screened for cancer and depression. Make sure that you are up to date with all your vaccines. This information is not intended to replace advice given to you by your health care provider. Make sure you discuss any questions you have with your health care provider. Document Revised: 12/23/2020 Document Reviewed: 12/23/2020 Elsevier Patient Education  2024 [employer].

## 2024-01-31 NOTE — Telephone Encounter (Signed)
 LVM to call back to office to inform pt of following message Per Dr Emerick Hanlon no further repeat is necessary . Please relay message and document

## 2024-01-31 NOTE — Telephone Encounter (Signed)
 Spoke to pt she just wanted clarification on Colonoscopy  explained to her what Dr Emerick Hanlon office stated pt verbalized understanding

## 2024-01-31 NOTE — Telephone Encounter (Unsigned)
 Copied from CRM [id number]. Topic: General - Other >> Jan 31, 2024 12:25 PM Mesmerise C wrote: Reason for CRM: Patient returning call read verbatim for colonoscopy no repeat is necessary patient wanted more context on it please call her back [phone]

## 2024-02-01 ENCOUNTER — Other Ambulatory Visit: Payer: Self-pay

## 2024-02-01 ENCOUNTER — Other Ambulatory Visit: Payer: Self-pay | Admitting: Family

## 2024-02-01 ENCOUNTER — Encounter: Payer: Self-pay | Admitting: Family

## 2024-02-01 DIAGNOSIS — Z122 Encounter for screening for malignant neoplasm of respiratory organs: Secondary | ICD-10-CM

## 2024-02-01 DIAGNOSIS — Z87891 Personal history of nicotine dependence: Secondary | ICD-10-CM

## 2024-02-01 DIAGNOSIS — M79671 Pain in right foot: Secondary | ICD-10-CM | POA: Insufficient documentation

## 2024-02-01 LAB — HOMOCYSTEINE: Homocysteine: 9.7 umol/L (ref 0.0–19.2)

## 2024-02-01 LAB — B12 AND FOLATE PANEL
Folate: >20 ng/mL (ref >3.0)
Vitamin B-12: 543 pg/mL (ref 232–1245)

## 2024-02-01 LAB — METHYLMALONIC ACID, SERUM: Methylmalonic Acid: 143 nmol/L (ref 0–378)

## 2024-02-01 NOTE — Assessment & Plan Note (Signed)
 I encouraged to have baseline CT calcium score to further stratify cardiovascular risk.  Provided information regarding this test.  She will let me know if she would like for me to order

## 2024-02-01 NOTE — Assessment & Plan Note (Addendum)
 Deferred clinical breast exam due to patient preference.  Deferred pelvic exam due to patient preference and she is no longer screening for cervical cancer.  Discussed last Pap smear approximately age of 30.  Discussed guidance to screen until 71 years of age.  She declines Pap smear today.  Encouraged continued exercise.  We discussed AAA screening due to history of smoking.  She will let me know if insurance coverage and she would like to pursue.  Due to recent finger laceration, provided Tdap. Given PCV20.

## 2024-02-01 NOTE — Assessment & Plan Note (Signed)
 Benign exam.  Etiology unclear.  Discussed  neuropathy.  Pending B12.  Podiatry consult if persists

## 2024-02-08 ENCOUNTER — Ambulatory Visit: Payer: Self-pay | Admitting: Family

## 2024-02-08 DIAGNOSIS — Z0000 Encounter for general adult medical examination without abnormal findings: Secondary | ICD-10-CM

## 2024-02-10 ENCOUNTER — Other Ambulatory Visit: Payer: Self-pay

## 2024-02-10 DIAGNOSIS — Z006 Encounter for examination for normal comparison and control in clinical research program: Secondary | ICD-10-CM

## 2024-02-16 ENCOUNTER — Other Ambulatory Visit (INDEPENDENT_AMBULATORY_CARE_PROVIDER_SITE_OTHER)

## 2024-02-16 DIAGNOSIS — Z13220 Encounter for screening for lipoid disorders: Secondary | ICD-10-CM

## 2024-02-16 DIAGNOSIS — Z131 Encounter for screening for diabetes mellitus: Secondary | ICD-10-CM | POA: Diagnosis not present

## 2024-02-16 DIAGNOSIS — Z0000 Encounter for general adult medical examination without abnormal findings: Secondary | ICD-10-CM

## 2024-02-16 LAB — COMPREHENSIVE METABOLIC PANEL WITH GFR
ALT: 14 U/L (ref 0–35)
AST: 20 U/L (ref 0–37)
Albumin: 4.5 g/dL (ref 3.5–5.2)
Alkaline Phosphatase: 76 U/L (ref 39–117)
BUN: 15 mg/dL (ref 6–23)
CO2: 28 meq/L (ref 19–32)
Calcium: 9.3 mg/dL (ref 8.4–10.5)
Chloride: 102 meq/L (ref 96–112)
Creatinine, Ser: 0.85 mg/dL (ref 0.40–1.20)
GFR: 68.91 mL/min (ref >60.00)
Glucose, Bld: 103 mg/dL — ABNORMAL HIGH (ref 70–99)
Potassium: 4.2 meq/L (ref 3.5–5.1)
Sodium: 137 meq/L (ref 135–145)
Total Bilirubin: 1.2 mg/dL (ref 0.2–1.2)
Total Protein: 7.3 g/dL (ref 6.0–8.3)

## 2024-02-16 LAB — LIPID PANEL
Cholesterol: 192 mg/dL (ref 0–200)
HDL: 58 mg/dL (ref >39.00)
LDL Cholesterol: 118 mg/dL — ABNORMAL HIGH (ref 0–99)
NonHDL: 133.87
Total CHOL/HDL Ratio: 3
Triglycerides: 78 mg/dL (ref 0.0–149.0)
VLDL: 15.6 mg/dL (ref 0.0–40.0)

## 2024-02-16 LAB — HEMOGLOBIN A1C: Hgb A1c MFr Bld: 5.9 % (ref 4.6–6.5)

## 2024-02-16 LAB — VITAMIN D 25 HYDROXY (VIT D DEFICIENCY, FRACTURES): VITD: 36.82 ng/mL (ref 30.00–100.00)

## 2024-02-16 LAB — TSH: TSH: 1.05 u[IU]/mL (ref 0.35–5.50)

## 2024-02-22 ENCOUNTER — Ambulatory Visit: Payer: Self-pay | Admitting: Family

## 2024-02-29 LAB — GENECONNECT MOLECULAR SCREEN: Genetic Analysis Overall Interpretation: NEGATIVE

## 2024-03-20 ENCOUNTER — Other Ambulatory Visit: Payer: Self-pay

## 2024-03-20 MED ORDER — PRAVASTATIN SODIUM 20 MG PO TABS: 20.0000 mg | ORAL_TABLET | Freq: Every day | ORAL | 1 refills | Status: DC

## 2024-03-21 ENCOUNTER — Other Ambulatory Visit: Payer: Self-pay | Admitting: Family

## 2024-03-21 ENCOUNTER — Encounter: Payer: Self-pay | Admitting: Family

## 2024-03-21 DIAGNOSIS — T753XXA Motion sickness, initial encounter: Secondary | ICD-10-CM

## 2024-03-21 MED ORDER — SCOPOLAMINE 1 MG/3DAYS TD PT72: 1.0000 | MEDICATED_PATCH | TRANSDERMAL | 0 refills | Status: DC

## 2024-05-18 ENCOUNTER — Encounter: Payer: Self-pay | Admitting: Dermatology

## 2024-05-18 ENCOUNTER — Ambulatory Visit: Payer: Medicare HMO | Admitting: Dermatology

## 2024-05-18 DIAGNOSIS — Z86018 Personal history of other benign neoplasm: Secondary | ICD-10-CM

## 2024-05-18 DIAGNOSIS — W908XXA Exposure to other nonionizing radiation, initial encounter: Secondary | ICD-10-CM

## 2024-05-18 DIAGNOSIS — L578 Other skin changes due to chronic exposure to nonionizing radiation: Secondary | ICD-10-CM

## 2024-05-18 DIAGNOSIS — L821 Other seborrheic keratosis: Secondary | ICD-10-CM | POA: Diagnosis not present

## 2024-05-18 DIAGNOSIS — L719 Rosacea, unspecified: Secondary | ICD-10-CM

## 2024-05-18 DIAGNOSIS — I781 Nevus, non-neoplastic: Secondary | ICD-10-CM | POA: Diagnosis not present

## 2024-05-18 DIAGNOSIS — L814 Other melanin hyperpigmentation: Secondary | ICD-10-CM

## 2024-05-18 DIAGNOSIS — D229 Melanocytic nevi, unspecified: Secondary | ICD-10-CM

## 2024-05-18 DIAGNOSIS — Z1283 Encounter for screening for malignant neoplasm of skin: Secondary | ICD-10-CM | POA: Diagnosis not present

## 2024-05-18 NOTE — Progress Notes (Signed)
 Follow-Up Visit   Subjective  Tara Coleman is a 73 y.o. female who presents for the following: Skin Cancer Screening and Full Body Skin Exam Hx of dyplastic nevus, hx of aks,  hx of rosacea reports some itch at eyes,   The patient presents for Total-Body Skin Exam (TBSE) for skin cancer screening and mole check. The patient has spots, moles and lesions to be evaluated, some may be new or changing and the patient may have concern these could be cancer.  The following portions of the chart were reviewed this encounter and updated as appropriate: medications, allergies, medical history  Review of Systems:  No other skin or systemic complaints except as noted in HPI or Assessment and Plan.  Objective  Well appearing patient in no apparent distress; mood and affect are within normal limits.  A full examination was performed including scalp, head, eyes, ears, nose, lips, neck, chest, axillae, abdomen, back, buttocks, bilateral upper extremities, bilateral lower extremities, hands, feet, fingers, toes, fingernails, and toenails. All findings within normal limits unless otherwise noted below.   Relevant physical exam findings are noted in the Assessment and Plan.    Assessment & Plan   ROSACEA Exam little pinkness at cheeks with telangiectasias   Chronic and persistent condition with duration or expected duration over one year. Condition is symptomatic/ bothersome to patient. Not currently at goal. Rosacea is a chronic progressive skin condition usually affecting the face of adults, causing redness and/or acne bumps. It is treatable but not curable. It sometimes affects the eyes (ocular rosacea) as well. It may respond to topical and/or systemic medication and can flare with stress, sun exposure, alcohol, exercise, topical steroids (including hydrocortisone/cortisone 10) and some foods.  Daily application of broad spectrum spf 30+ sunscreen to face is recommended to reduce  flares.  Patient denies grittiness of the eyes   Treatment Plan Discussed treatment, patient does not wish to start treatment Recommend sunscreen daily   SKIN CANCER SCREENING PERFORMED TODAY.  ACTINIC DAMAGE - Chronic condition, secondary to cumulative UV/sun exposure - diffuse scaly erythematous macules with underlying dyspigmentation - Recommend daily broad spectrum sunscreen SPF 30+ to sun-exposed areas, reapply every 2 hours as needed.  - Staying in the shade or wearing long sleeves, sun glasses (UVA+UVB protection) and wide brim hats (4-inch brim around the entire circumference of the hat) are also recommended for sun protection.  - Call for new or changing lesions.  LENTIGINES, SEBORRHEIC KERATOSES, HEMANGIOMAS - Benign normal skin lesions - Benign-appearing - Call for any changes  MELANOCYTIC NEVI - Tan-brown and/or pink-flesh-colored symmetric macules and papules - Benign appearing on exam today - Observation - Call clinic for new or changing moles - Recommend daily use of broad spectrum spf 30+ sunscreen to sun-exposed areas.   Varicose Veins/Spider Veins - Dilated blue, purple or red veins at the lower extremities - Reassured - Smaller vessels can be treated by sclerotherapy (a procedure to inject a medicine into the veins to make them disappear) if desired, but the treatment is not covered by insurance. Larger vessels may be covered if symptomatic and we would refer to vascular surgeon if treatment desired.  HISTORY OF DYSPLASTIC NEVUS 11/15/2013 mid back - mild  No evidence of recurrence today Recommend regular full body skin exams Recommend daily broad spectrum sunscreen SPF 30+ to sun-exposed areas, reapply every 2 hours as needed.  Call if any new or changing lesions are noted between office visits   Return in about 1 year (  around 05/18/2025) for TBSE.  IEleanor Blush, CMA, am acting as scribe for Alm Rhyme, MD.   Documentation: I have reviewed the  above documentation for accuracy and completeness, and I agree with the above.  Alm Rhyme, MD

## 2024-05-18 NOTE — Patient Instructions (Signed)

## 2024-06-09 ENCOUNTER — Ambulatory Visit: Payer: Self-pay

## 2024-06-09 DIAGNOSIS — J209 Acute bronchitis, unspecified: Secondary | ICD-10-CM | POA: Diagnosis not present

## 2024-06-09 DIAGNOSIS — J019 Acute sinusitis, unspecified: Secondary | ICD-10-CM | POA: Diagnosis not present

## 2024-06-09 DIAGNOSIS — B9689 Other specified bacterial agents as the cause of diseases classified elsewhere: Secondary | ICD-10-CM | POA: Diagnosis not present

## 2024-06-09 NOTE — Telephone Encounter (Unsigned)
 Copied from CRM [id number]. Topic: General - Other >> Jun 09, 2024  4:49 PM Delon DASEN wrote: Reason for CRM: returning call from office, went to a walk in clinic and received prescriptions

## 2024-06-09 NOTE — Telephone Encounter (Signed)
 This RN made second attempt to triage patient. No answer, LVM. Routing for additional attempts.

## 2024-06-09 NOTE — Telephone Encounter (Addendum)
 This RN made first attempt to triage patient. No answer, LVM. Routing for additional attempts.   Copied from CRM #[id number]. Topic: Clinical - Medication Question >> Jun 09, 2024  8:14 AM [name] wrote: Reason for CRM:  Tara Coleman wants to see if FNP Arnett would call her in something for her dry cough. She is congested and she is losing her voice on and off. This has been going on for about 10 days. Please call her at [phone] to discuss what she can take or if a medication can be called into her pharmacy [facility]). Thanks

## 2024-06-09 NOTE — Telephone Encounter (Signed)
 Noted

## 2024-06-09 NOTE — Telephone Encounter (Signed)
 This RN made third and final attempt to triage patient. No answer, LVM. Routing to office for follow-up.

## 2024-06-09 NOTE — Telephone Encounter (Signed)
 LVM and sent my chart message to inform pt of message below per Rollene!!   Call pt   Advise mucinex dm OTC please ask her to VV on mychart over the weekend if sxs worsen I cannot call in abx without an appointment!!.

## 2024-06-12 NOTE — Telephone Encounter (Signed)
 noted

## 2024-06-12 NOTE — Telephone Encounter (Signed)
 Spoke to pt she went to [facility] to be seen

## 2024-08-21 ENCOUNTER — Telehealth: Payer: Self-pay | Admitting: Family

## 2024-08-21 ENCOUNTER — Ambulatory Visit
Admission: RE | Admit: 2024-08-21 | Discharge: 2024-08-21 | Disposition: A | Discharge status: Home / Self Care | Source: Ambulatory Visit | Attending: Family | Admitting: Family

## 2024-08-21 DIAGNOSIS — J189 Pneumonia, unspecified organism: Secondary | ICD-10-CM

## 2024-08-21 DIAGNOSIS — R9389 Abnormal findings on diagnostic imaging of other specified body structures: Secondary | ICD-10-CM

## 2024-08-21 MED ORDER — AZITHROMYCIN 250 MG PO TABS: ORAL_TABLET | ORAL | 0 refills | Status: DC

## 2024-08-21 NOTE — Telephone Encounter (Signed)
 Referral team Sch CT chest stat today; let me known when scheduled   Tristy Udovich team  Call pt Sch appt this week to see me;  Can you sch her 1pm on 08/23/24?   Rec'd phonecall from husband woody 08/20/24 Concerning his wife is [facility] for URI.  She had been at [facility] urgent care 08/19/2024 .  Negative respiratory viral panel.  Started on Tamiflu.    Re presented to [facility] 08/21/24. Reports Chest x-ray abnormal.  Concern for lung mass.  Husband is requesting CT chest.  She is compliant on prednisone , Augmentin at this time.  Declines appointment 08/21/2024.  Former smoker

## 2024-08-21 NOTE — Telephone Encounter (Signed)
 Detailed vm left informing pt that PCP wanted to see her this week and that we made room on PCP's schedule on 08-24-24 @ 12:30 pm. Mychart msg also sent to pt as well to inform    E2C2 PLEASE RELAY THAT INFO TO THE PT ROUTE MSG BACK ONCE PT HAS BEEN INFORMED FOR CONFIRMATION

## 2024-08-22 ENCOUNTER — Ambulatory Visit: Admitting: Pulmonary Disease

## 2024-08-22 ENCOUNTER — Encounter: Payer: Self-pay | Admitting: Pulmonary Disease

## 2024-08-22 VITALS — BP 124/68 | HR 70 | Temp 97.6°F | Ht 67.0 in | Wt 194.0 lb

## 2024-08-22 DIAGNOSIS — R0989 Other specified symptoms and signs involving the circulatory and respiratory systems: Secondary | ICD-10-CM

## 2024-08-22 DIAGNOSIS — R051 Acute cough: Secondary | ICD-10-CM

## 2024-08-22 DIAGNOSIS — J189 Pneumonia, unspecified organism: Secondary | ICD-10-CM

## 2024-08-22 MED ORDER — ALBUTEROL SULFATE (2.5 MG/3ML) 0.083% IN NEBU
2.5000 mg | INHALATION_SOLUTION | Freq: Once | RESPIRATORY_TRACT | Status: AC
  Administered 2024-08-22: 2.5 mg via RESPIRATORY_TRACT

## 2024-08-22 MED ORDER — VORTEX VALVED HOLDING CHAMBER DEVI: 0 refills | Status: AC

## 2024-08-22 NOTE — Progress Notes (Unsigned)
 "  Subjective:    Patient ID: Tara Coleman, female    DOB: [birth year], 74 y.o.   MRN: [id number]  Patient Care Team: Dineen Rollene MATSU, FNP as PCP - General (Family Medicine)  No chief complaint on file.   BACKGROUND: Patient presents for evaluation of a very dense left upper lobe community-acquired pneumonia she is kindly referred by Rollene Dineen, FNP.   HPI    Review of Systems A 10 point review of systems was performed and it is as noted above otherwise negative.   Past Medical History:  Diagnosis Date   Dysplastic nevus 11/15/2013   mid back - mild    Thyroid  disease     Past Surgical History:  Procedure Laterality Date   ADENOIDECTOMY     as a child   CESAREAN SECTION     x2    COLONOSCOPY     COLONOSCOPY WITH PROPOFOL  N/A 12/19/2021   Procedure: COLONOSCOPY WITH PROPOFOL ;  Surgeon: Maryruth Ole DASEN, MD;  Location: [facility] ENDOSCOPY;  Service: Endoscopy;  Laterality: N/A;    Patient Active Problem List   Diagnosis Date Noted   Right foot pain 02/01/2024   Respiratory illness 03/04/2022   Benign liver cyst 05/03/2020   Acute bronchitis with COPD (HCC) 04/28/2018   Atherosclerosis of aorta 09/30/2017   Personal history of tobacco use, presenting hazards to health 09/11/2016   Vertigo 09/24/2015   Dyspnea 10/02/2014   Obesity (BMI 30-39.9) 10/02/2014   Routine general medical examination at a health care facility 09/28/2013   Rosacea 09/28/2013   Screening for skin cancer 09/28/2013   Hypothyroidism 08/04/2011    Family History  Problem Relation Age of Onset   Cancer Mother 37       biliary duct   Thyroid  disease Sister    Colon cancer Maternal Grandfather        lived to 47   Psoriasis Son    Breast cancer Neg Hx     Social History   Tobacco Use   Smoking status: Former    Current packs/day: 0.00    Average packs/day: 1 pack/day for 30.0 years (30.0 ttl pk-yrs)    Types: Cigarettes    Start date: 04/17/1988     Quit date: 04/17/2018    Years since quitting: 6.3   Smokeless tobacco: Never  Substance Use Topics   Alcohol use: Yes    Alcohol/week: 0.0 standard drinks of alcohol    Comment: Glass wine hs    Allergies[1]  Active Medications[2]  Immunization History  Administered Date(s) Administered    sv, Bivalent, Protein Subunit Rsvpref,pf (Abrysvo) 05/20/2022   Fluad Quad(high Dose 65+) 06/12/2021, 06/02/2022   INFLUENZA, HIGH DOSE SEASONAL PF 04/27/2023   Influenza, Quadrivalent, Recombinant, Inj, Pf 05/08/2019   Influenza-Unspecified 05/17/2020   Moderna Covid-19 Fall Seasonal Vaccine 58yrs & older 09/18/2022   PFIZER Comirnaty(Gray Top)Covid-19 Tri-Sucrose Vaccine 11/19/2020   PFIZER(Purple Top)SARS-COV-2 Vaccination 08/21/2019, 09/11/2019, 05/24/2020   PNEUMOCOCCAL CONJUGATE-20 06/02/2022, 01/27/2024   Pfizer Covid-19 Vaccine Bivalent Booster 71yrs & up 04/28/2021   Pfizer(Comirnaty)Fall Seasonal Vaccine 12 years and older 04/27/2023   Pneumococcal Conjugate,unspecified 11/10/2017   Pneumococcal Conjugate-13 11/10/2017, 05/08/2019   Pneumococcal Polysaccharide-23 09/10/2016   Respiratory Syncytial Virus Vaccine,Recomb Aduvanted(Arexvy) 05/20/2022   Tdap 08/05/2005, 08/27/2016, 01/27/2024   Zoster Recombinant(Shingrix) 02/27/2021, 05/26/2021   Zoster, Live 07/28/2013        Objective:     There were no vitals filed for this visit.      GENERAL: HEAD: Normocephalic, atraumatic.  EYES: Pupils equal, round, reactive to light.  No scleral icterus.  MOUTH:  NECK: Supple. No thyromegaly. Trachea midline. No JVD.  No adenopathy. PULMONARY: Good air entry bilaterally.  No adventitious sounds. CARDIOVASCULAR: S1 and S2. Regular rate and rhythm.  ABDOMEN: MUSCULOSKELETAL: No joint deformity, no clubbing, no edema.  NEUROLOGIC:  SKIN: Intact,warm,dry. PSYCH:        Assessment & Plan:   No diagnosis found.  No orders of the defined types were placed  in this encounter.   No orders of the defined types were placed in this encounter.    Advised if symptoms do not improve or worsen, to please contact office for sooner follow up or seek emergency care.    I spent xxx minutes of dedicated to the care of this patient on the date of this encounter to include pre-visit review of records, face-to-face time with the patient discussing conditions above, post visit ordering of testing, clinical documentation with the electronic health record, making appropriate referrals as documented, and communicating necessary findings to members of the patients care team.   C. Leita Sanders, MD Advanced Bronchoscopy PCCM  [facility]    *This note was dictated using voice recognition software/Dragon.  Despite best efforts to proofread, errors can occur which can change the meaning. Any transcriptional errors that result from this process are unintentional and may not be fully corrected at the time of dictation.     [1] No Known Allergies [2] No outpatient medications have been marked as taking for the 08/22/24 encounter (Appointment) with Sanders Dedra CROME, MD.  "

## 2024-08-22 NOTE — Patient Instructions (Signed)
 VISIT SUMMARY:  During your visit, we evaluated the dense infiltrate in your left upper lobe, which was initially identified on an x-ray. You have been experiencing symptoms such as minimal cough, occasional wheezing, significant fatigue, and decreased appetite. You have started antibiotic treatment and have been using an inhaler and nebulizer treatment.  YOUR PLAN:  -PNEUMONIA, LEFT UPPER LOBE: Pneumonia is an infection that inflames the air sacs in one or both lungs. Your pneumonia is located in the left upper lobe and is likely caused by bacteria. We expect improvement with your current antibiotics, although it may take 6-8 weeks for the x-ray to show full resolution. Continue your antibiotics as prescribed. We administered a nebulizer treatment to help loosen secretions and provided education on proper inhaler use with a spacer. Monitor for any worsening symptoms, such as increased wheezing or congestion.  INSTRUCTIONS:  Please continue your current antibiotics regimen and use the inhaler with the spacer as instructed. Monitor for any worsening symptoms, such as increased wheezing or congestion. We have scheduled a follow-up appointment in two weeks to assess your progress.

## 2024-08-23 ENCOUNTER — Encounter: Payer: Self-pay | Admitting: Pulmonary Disease

## 2024-08-24 ENCOUNTER — Encounter: Payer: Self-pay | Admitting: Family

## 2024-08-24 ENCOUNTER — Ambulatory Visit (INDEPENDENT_AMBULATORY_CARE_PROVIDER_SITE_OTHER): Admitting: Family

## 2024-08-24 VITALS — BP 136/70 | HR 75 | Ht 67.0 in | Wt 193.2 lb

## 2024-08-24 DIAGNOSIS — J189 Pneumonia, unspecified organism: Secondary | ICD-10-CM

## 2024-08-24 DIAGNOSIS — I700 Atherosclerosis of aorta: Secondary | ICD-10-CM | POA: Diagnosis not present

## 2024-08-24 DIAGNOSIS — E039 Hypothyroidism, unspecified: Secondary | ICD-10-CM

## 2024-08-24 MED ORDER — MELOXICAM 7.5 MG PO TABS: 7.5000 mg | ORAL_TABLET | Freq: Every day | ORAL | 1 refills | Status: DC | PRN

## 2024-08-24 MED ORDER — ALBUTEROL SULFATE (2.5 MG/3ML) 0.083% IN NEBU
2.5000 mg | INHALATION_SOLUTION | Freq: Once | RESPIRATORY_TRACT | Status: AC
  Administered 2024-08-24: 2.5 mg via RESPIRATORY_TRACT

## 2024-08-24 MED ORDER — AMOXICILLIN-POT CLAVULANATE 875-125 MG PO TABS: 1.0000 | ORAL_TABLET | Freq: Two times a day (BID) | ORAL | 0 refills | Status: AC

## 2024-08-24 MED ORDER — LEVOTHYROXINE SODIUM 100 MCG PO TABS: ORAL_TABLET | ORAL | 3 refills | Status: AC

## 2024-08-24 MED ORDER — PRAVASTATIN SODIUM 10 MG PO TABS: 10.0000 mg | ORAL_TABLET | Freq: Every day | ORAL | 3 refills | Status: AC

## 2024-08-24 NOTE — Assessment & Plan Note (Signed)
 No respiratory distress.  She is afebrile.  Reviewed CT chest and evaluation with Dr. Tamea.  Provided albuterol  nebulizer to help break up secretions.  Encouraged scheduling albuterol  inhaler at home.  Agreed to extend Augmentin  by 1 week with probiotics. Discussed left-sided back pain.  Differential includes pleurisy and/or degenerative disc disease.  Patient politely declines formal evaluation of thoracic or lumbar spine.  Provided meloxicam  to use for a few days.  Advise topical therapy.  She will let me know how she is doing.Pending echocardiogram.

## 2024-08-24 NOTE — Progress Notes (Signed)
 "  Assessment & Plan:  Pneumonia of left lung due to infectious organism, unspecified part of lung Assessment & Plan: No respiratory distress.  She is afebrile.  Reviewed CT chest and evaluation with Dr. Tamea.  Provided albuterol  nebulizer to help break up secretions.  Encouraged scheduling albuterol  inhaler at home.  Agreed to extend Augmentin  by 1 week with probiotics. Discussed left-sided back pain.  Differential includes pleurisy and/or degenerative disc disease.  Patient politely declines formal evaluation of thoracic or lumbar spine.  Provided meloxicam  to use for a few days.  Advise topical therapy.  She will let me know how she is doing.Pending echocardiogram.   Orders: -     Amoxicillin -Pot Clavulanate; Take 1 tablet by mouth 2 (two) times daily for 7 days.  Dispense: 14 tablet; Refill: 0 -     Meloxicam ; Take 1 tablet (7.5 mg total) by mouth daily as needed for pain.  Dispense: 30 tablet; Refill: 1 -     Albuterol  Sulfate  Hypothyroidism, unspecified type -     Levothyroxine  Sodium; TAKE 1 TABLET BY MOUTH EVERY MORNING 30 MINUTES BEFORE BREAKFAST  Dispense: 90 tablet; Refill: 3  Atherosclerosis of aorta -     Pravastatin  Sodium; Take 1 tablet (10 mg total) by mouth daily.  Dispense: 90 tablet; Refill: 3     Return precautions given.   Risks, benefits, and alternatives of the medications and treatment plan prescribed today were discussed, and patient expressed understanding.   Education regarding symptom management and diagnosis given to patient on AVS either electronically or printed.  Return in about 2 months (around 10/22/2024).  Rollene Northern, FNP  Subjective:    Patient ID: Tara Coleman, female    DOB: [birth year], 74 y.o.   MRN: [id number]  CC: Tara Coleman is a 74 y.o. female who presents today for an acute visit.    HPI: She is accompanied by husband today   Discussed the use of AI scribe software for clinical note transcription with the  patient, who gave verbal consent to proceed.  History of Present Illness   Tara Coleman is a 74 year old female who presents with PNA follow up and left-sided back pain  She has persistent left-sided back pain, described as deep and located under the bra strap, extending to her side and lower back. The pain affects her sleep, as she cannot lie on her side comfortably. It began after a severe bout of coughing associated with pneumonia. Ibuprofen provides some relief, allowing her to sleep, but the pain returns the next day.  She has completed a course of prednisone  and is currently on Augmentin , with a few days remaining.  She has completed azithromycin . she has not used the albuterol  inhaler .  Her cough has decreased, and she is no longer wheezing. She has been using Mucinex DM and tussionex for cough and ibuprofen for pain management.  She reports feeling very tired. She has been sleeping a lot since the onset of her symptoms. She initially experienced chills, body aches, and loss of appetite  She has been monitoring sa02 at home ranging between 93 to 95%   No numbness in legs,  wheezing or shortness of breath.      consult Dr. Tamea 08/22/2024 for community-acquired pneumonia.  Nebulizer given in the office.   Pulmonology follow-up scheduled 09/05/2024  CT chest 08/21/24 1. Left upper lobe pneumonia occupies nearly the entire left upper lobe. 2. Small left parapneumonic effusion. 3. Enlarged mediastinal lymph nodes,  likely reactive in etiology. 4. Suggest repeat CT chest with IV contrast in 3-4 weeks, appropriate clinical therapy, to ensure improvement and/or resolution of these findings. 5. Cholelithiasis. 6. Enlarged pulmonic trunk, indicative of pulmonary arterial hypertension.  Pending echocardiogram  Allergies: Patient has no known allergies. Medications Ordered Prior to Encounter[1]    Review of Systems  Constitutional:  Negative for chills and fever.  HENT:   Negative for congestion, postnasal drip and sinus pressure.   Respiratory:  Positive for cough. Negative for shortness of breath and wheezing.   Cardiovascular:  Negative for chest pain and palpitations.  Gastrointestinal:  Negative for nausea and vomiting.      Objective:    BP 136/70   Pulse 75   Ht 5' 7 (1.702 m)   Wt 193 lb 3.2 oz (87.6 kg)   SpO2 92%   BMI 30.26 kg/m   BP Readings from Last 3 Encounters:  08/24/24 136/70  08/22/24 124/68  01/27/24 124/70   Wt Readings from Last 3 Encounters:  08/24/24 193 lb 3.2 oz (87.6 kg)  08/22/24 194 lb (88 kg)  01/27/24 198 lb 6.4 oz (90 kg)    Physical Exam Vitals reviewed.  Constitutional:      Appearance: She is well-developed.  Eyes:     Conjunctiva/sclera: Conjunctivae normal.  Cardiovascular:     Rate and Rhythm: Normal rate and regular rhythm.     Pulses: Normal pulses.     Heart sounds: Normal heart sounds.  Pulmonary:     Effort: Pulmonary effort is normal.     Breath sounds: Normal breath sounds. No wheezing, rhonchi or rales.       Comments: Pain is described by patient left mid thoracic back.  No rash, erythema, bony step-off. Decreased breath sounds left lower lung.  Weak sounding cough. Skin:    General: Skin is warm and dry.  Neurological:     Mental Status: She is alert.  Psychiatric:        Speech: Speech normal.        Behavior: Behavior normal.        Thought Content: Thought content normal.   Patient felt significantly better after albuterol  treatment. Lung sounds clear and increased        [1]  Current Outpatient Medications on File Prior to Visit  Medication Sig Dispense Refill   albuterol  (VENTOLIN  HFA) 108 (90 Base) MCG/ACT inhaler Inhale 2 puffs into the lungs every 4 (four) hours as needed for wheezing or shortness of breath.     azithromycin  (ZITHROMAX ) 250 MG tablet Tale 500 mg PO on day 1, then 250 mg PO q24h x 4 days. 6 tablet 0   HYDROCODONE-CHLORPHENIRAMINE PO Take 5 mLs by  mouth 2 (two) times daily as needed.     Spacer/Aero-Holding Chambers (VORTEX VALVED HOLDING CHAMBER) DEVI Use as directed for inhaler medication delivery. 1 each 0   No current facility-administered medications on file prior to visit.   "

## 2024-08-24 NOTE — Patient Instructions (Signed)
 For left back pain.  Certainly question if this is musculoskeletal versus pleurisy.  You may stop over-the-counter ibuprofen and instead use meloxicam  which is a potent anti-inflammatory.  Please take with food.  You may also use Salonpas over-the-counter, heat or ice   A couple of points in regards to meloxicam  ( Mobic ) -  This medication is not intended for daily , long term use. It is a potent anti inflammatory ( NSAID), and my intention is for you take as needed for moderate to severe pain. If you find yourself using daily, please let me know.   Please takes Mobic  ( meloxicam ) with FOOD since it is an anti-inflammatory as it can cause a GI bleed or ulcer. If you have a history of GI bleed or ulcer, please do NOT take.  Do no take over the counter aleve, motrin, advil, goody's powder for pain as they are also NSAIDs, and they are  in the same class as Mobic   Lastly, we will need to monitor kidney function while on Mobic , and if we were to see any decline in kidney function in the future, we would have to discontinue this medication.   I have refilled Augmentin  clavulanate for 7 days.  Ensure to take probiotics while on antibiotics and also for 2 weeks after completion. This can either be by eating yogurt daily or taking a probiotic supplement over the counter such as Culturelle.It is important to re-colonize the gut with good bacteria and also to prevent any diarrheal infections associated with antibiotic use.   Use albuterol  every 6 hours for first 24 hours to get good medication into the lungs and loosen congestion; after, you may use as needed and eventually stop all together when cough resolves.  Please keep follow-up Dr. Tamea.  I will certainly be following along.  I have ordered an echocardiogram of your heart.  Please let me know if you are not called from [facility] heart care in the next 2 weeks

## 2024-09-05 ENCOUNTER — Ambulatory Visit: Admitting: Pulmonary Disease

## 2024-09-05 ENCOUNTER — Ambulatory Visit
Admission: RE | Admit: 2024-09-05 | Discharge: 2024-09-05 | Disposition: A | Discharge status: Home / Self Care | Source: Ambulatory Visit | Attending: Pulmonary Disease | Admitting: Pulmonary Disease

## 2024-09-05 ENCOUNTER — Encounter: Payer: Self-pay | Admitting: Pulmonary Disease

## 2024-09-05 ENCOUNTER — Ambulatory Visit
Admission: RE | Admit: 2024-09-05 | Discharge: 2024-09-05 | Disposition: A | Source: Ambulatory Visit | Attending: Pulmonary Disease | Admitting: Pulmonary Disease

## 2024-09-05 VITALS — BP 114/76 | HR 70 | Temp 99.1°F | Ht 67.0 in | Wt 191.4 lb

## 2024-09-05 DIAGNOSIS — Z87891 Personal history of nicotine dependence: Secondary | ICD-10-CM

## 2024-09-05 DIAGNOSIS — J189 Pneumonia, unspecified organism: Secondary | ICD-10-CM

## 2024-09-05 NOTE — Progress Notes (Unsigned)
 "  Subjective:    Patient ID: Tara Coleman, female    DOB: Apr 06, 1951, 74 y.o.   MRN: [id number]  Patient Care Team: Dineen Rollene MATSU, FNP as PCP - General (Family Medicine)  Chief Complaint  Patient presents with   Follow-up    Much improved.  Pneumonia sx resolved.  Slight cough at times.    BACKGROUND/INTERVAL:  HPI   Review of Systems A 10 point review of systems was performed and it is as noted above otherwise negative.   Patient Active Problem List   Diagnosis Date Noted   PNA (pneumonia) 08/24/2024   Right foot pain 02/01/2024   Respiratory illness 03/04/2022   Benign liver cyst 05/03/2020   Acute bronchitis with COPD (HCC) 04/28/2018   Atherosclerosis of aorta 09/30/2017   Personal history of tobacco use, presenting hazards to health 09/11/2016   Vertigo 09/24/2015   Dyspnea 10/02/2014   Obesity (BMI 30-39.9) 10/02/2014   Routine general medical examination at a health care facility 09/28/2013   Rosacea 09/28/2013   Screening for skin cancer 09/28/2013   Hypothyroidism 08/04/2011    Social History   Tobacco Use   Smoking status: Former    Current packs/day: 0.00    Average packs/day: 1 pack/day for 30.0 years (30.0 ttl pk-yrs)    Types: Cigarettes    Start date: 04/17/1988    Quit date: 04/17/2018    Years since quitting: 6.3   Smokeless tobacco: Never  Substance Use Topics   Alcohol use: Yes    Alcohol/week: 0.0 standard drinks of alcohol    Comment: Glass wine hs    Allergies[1]  Active Medications[2]  Immunization History  Administered Date(s) Administered    sv, Bivalent, Protein Subunit Rsvpref,pf (Abrysvo) 05/20/2022   Fluad Quad(high Dose 65+) 06/12/2021, 06/02/2022   INFLUENZA, HIGH DOSE SEASONAL PF 04/27/2023   Influenza, Quadrivalent, Recombinant, Inj, Pf 05/08/2019   Influenza-Unspecified 05/17/2020   Moderna Covid-19 Fall Seasonal Vaccine 3yrs & older 09/18/2022   PFIZER Comirnaty(Gray Top)Covid-19  Tri-Sucrose Vaccine 11/19/2020   PFIZER(Purple Top)SARS-COV-2 Vaccination 08/21/2019, 09/11/2019, 05/24/2020   PNEUMOCOCCAL CONJUGATE-20 06/02/2022, 01/27/2024   Pfizer Covid-19 Vaccine Bivalent Booster 71yrs & up 04/28/2021   Pfizer(Comirnaty)Fall Seasonal Vaccine 12 years and older 04/27/2023   Pneumococcal Conjugate,unspecified 11/10/2017   Pneumococcal Conjugate-13 11/10/2017, 05/08/2019   Pneumococcal Polysaccharide-23 09/10/2016   Respiratory Syncytial Virus Vaccine,Recomb Aduvanted(Arexvy) 05/20/2022   Tdap 08/05/2005, 08/27/2016, 01/27/2024   Zoster Recombinant(Shingrix) 02/27/2021, 05/26/2021   Zoster, Live 07/28/2013        Objective:     Vitals:   09/05/24 1148  BP: 114/76  Pulse: 70  Temp: 99.1 F (37.3 C)  Height: 5' 7 (1.702 m)  Weight: 191 lb 6.4 oz (86.8 kg)  SpO2: 98% Comment: room air  TempSrc: Oral  BMI (Calculated): 29.97     SpO2: 98 % (room air)  GENERAL: HEAD: Normocephalic, atraumatic.  EYES: Pupils equal, round, reactive to light.  No scleral icterus.  MOUTH:  NECK: Supple. No thyromegaly. Trachea midline. No JVD.  No adenopathy. PULMONARY: Good air entry bilaterally.  No adventitious sounds. CARDIOVASCULAR: S1 and S2. Regular rate and rhythm.  ABDOMEN: MUSCULOSKELETAL: No joint deformity, no clubbing, no edema.  NEUROLOGIC:  SKIN: Intact,warm,dry. PSYCH:        Assessment & Plan:     ICD-10-CM   1. Community acquired pneumonia of left upper lobe of lung  J18.9 DG Chest 2 View    CT CHEST WO CONTRAST      Orders Placed This  Encounter  Procedures   DG Chest 2 View    Standing Status:   Future    Expected Date:   09/05/2024    Expiration Date:   09/05/2025    Reason for Exam (SYMPTOM  OR DIAGNOSIS REQUIRED):   F/U pneumonia    Preferred imaging location?:   [facility]   CT CHEST WO CONTRAST    Standing Status:   Future    Expected Date:   09/25/2024    Expiration Date:   09/05/2025    Preferred imaging  location?:   [facility]    No orders of the defined types were placed in this encounter.     Advised if symptoms do not improve or worsen, to please contact office for sooner follow up or seek emergency care.    I spent xxx minutes of dedicated to the care of this patient on the date of this encounter to include pre-visit review of records, face-to-face time with the patient discussing conditions above, post visit ordering of testing, clinical documentation with the electronic health record, making appropriate referrals as documented, and communicating necessary findings to members of the patients care team.     C. Leita Sanders, MD Advanced Bronchoscopy P[facility]    *This note was generated using voice recognition software/Dragon and/or AI transcription program.  Despite best efforts to proofread, errors can occur which can change the meaning. Any transcriptional errors that result from this process are unintentional and may not be fully corrected at the time of dictation.      [1] No Known Allergies [2] Current Meds  Medication Sig   albuterol  (VENTOLIN  HFA) 108 (90 Base) MCG/ACT inhaler Inhale 2 puffs into the lungs every 4 (four) hours as needed for wheezing or shortness of breath.   levothyroxine  (SYNTHROID ) 100 MCG tablet TAKE 1 TABLET BY MOUTH EVERY MORNING 30 MINUTES BEFORE BREAKFAST   pravastatin  (PRAVACHOL ) 10 MG tablet Take 1 tablet (10 mg total) by mouth daily.  "

## 2024-09-05 NOTE — Patient Instructions (Signed)
 VISIT SUMMARY:  You had a follow-up appointment today to check on your pneumonia. You reported significant improvement in your symptoms and feel almost back to normal. Your side pain has decreased significantly but still persists slightly.  YOUR PLAN:  -PNEUMONIA: Pneumonia is an infection that inflames the air sacs in one or both lungs. You have shown significant improvement, but you still have mild residual pain likely due to irritation of the lung lining. We have ordered a chest x-ray today to assess your current status and scheduled a follow-up CT scan for mid-February. You should engage in comfortable activities and avoid cold exposure. If outdoor walking is uncomfortable, consider walking indoors in a controlled environment. We will review your x-ray results and call you with the findings.  INSTRUCTIONS:  Please follow up with the chest x-ray today and attend the scheduled CT scan in mid-February. We will call you with the results of the x-ray.

## 2024-09-05 NOTE — Addendum Note (Signed)
 Addended by: DIONISIO AQUAS on: 09/05/2024 02:22 PM   Modules accepted: Orders

## 2024-09-06 ENCOUNTER — Encounter: Payer: Self-pay | Admitting: Pulmonary Disease

## 2024-09-12 ENCOUNTER — Ambulatory Visit: Payer: Self-pay | Admitting: Pulmonary Disease

## 2024-09-13 ENCOUNTER — Ambulatory Visit

## 2024-09-13 DIAGNOSIS — J189 Pneumonia, unspecified organism: Secondary | ICD-10-CM | POA: Diagnosis not present

## 2024-09-13 LAB — ECHOCARDIOGRAM COMPLETE
AR max vel: 2.75 cm2
AV Area VTI: 2.62 cm2
AV Area mean vel: 2.75 cm2
AV Mean grad: 3 mmHg
AV Peak grad: 6.5 mmHg
Ao pk vel: 1.27 m/s
Area-P 1/2: 4.49 cm2
S' Lateral: 2.69 cm

## 2024-09-19 ENCOUNTER — Encounter: Payer: Self-pay | Admitting: Family

## 2024-09-20 ENCOUNTER — Ambulatory Visit: Payer: Self-pay | Admitting: Family

## 2024-10-03 ENCOUNTER — Ambulatory Visit

## 2024-10-17 ENCOUNTER — Ambulatory Visit: Admitting: Pulmonary Disease

## 2024-10-24 ENCOUNTER — Ambulatory Visit: Admitting: Family

## 2025-05-24 ENCOUNTER — Ambulatory Visit: Admitting: Dermatology
